# Patient Record
Sex: Female | Born: 2000 | Race: Black or African American | Hispanic: Yes | Marital: Single | Smoking: Former smoker
Health system: Southern US, Community
[De-identification: ages and names within clinical notes are randomized; demographics above are authoritative.]

## PROBLEM LIST (undated history)

## (undated) DIAGNOSIS — A599 Trichomoniasis, unspecified: Secondary | ICD-10-CM

## (undated) DIAGNOSIS — J039 Acute tonsillitis, unspecified: Secondary | ICD-10-CM

## (undated) DIAGNOSIS — J45909 Unspecified asthma, uncomplicated: Secondary | ICD-10-CM

## (undated) DIAGNOSIS — T7840XA Allergy, unspecified, initial encounter: Secondary | ICD-10-CM

## (undated) HISTORY — DX: Unspecified asthma, uncomplicated: J45.909

## (undated) HISTORY — DX: Allergy, unspecified, initial encounter: T78.40XA

---
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
## 2020-04-29 ENCOUNTER — Other Ambulatory Visit: Payer: Self-pay

## 2020-04-29 ENCOUNTER — Encounter (HOSPITAL_COMMUNITY): Payer: Self-pay | Admitting: Emergency Medicine

## 2020-04-29 ENCOUNTER — Emergency Department (HOSPITAL_COMMUNITY)
Admission: EM | Admit: 2020-04-29 | Discharge: 2020-04-29 | Disposition: A | Discharge status: Home / Self Care | Payer: Medicaid Other | Attending: Emergency Medicine | Admitting: Emergency Medicine

## 2020-04-29 DIAGNOSIS — R1030 Lower abdominal pain, unspecified: Secondary | ICD-10-CM | POA: Diagnosis not present

## 2020-04-29 DIAGNOSIS — Z3A00 Weeks of gestation of pregnancy not specified: Secondary | ICD-10-CM | POA: Diagnosis not present

## 2020-04-29 DIAGNOSIS — O26899 Other specified pregnancy related conditions, unspecified trimester: Secondary | ICD-10-CM | POA: Diagnosis not present

## 2020-04-29 DIAGNOSIS — Z5321 Procedure and treatment not carried out due to patient leaving prior to being seen by health care provider: Secondary | ICD-10-CM | POA: Diagnosis not present

## 2020-04-29 LAB — I-STAT BETA HCG BLOOD, ED (MC, WL, AP ONLY): I-stat hCG, quantitative: >2000 m[IU]/mL — ABNORMAL HIGH (ref <5)

## 2020-04-29 LAB — URINALYSIS, ROUTINE W REFLEX MICROSCOPIC
Bacteria, UA: NONE SEEN
Bilirubin Urine: NEGATIVE
Glucose, UA: NEGATIVE mg/dL
Hgb urine dipstick: NEGATIVE
Ketones, ur: 80 mg/dL — AB
Nitrite: NEGATIVE
Protein, ur: NEGATIVE mg/dL
Specific Gravity, Urine: 1.028 (ref 1.005–1.030)
pH: 6 (ref 5.0–8.0)

## 2020-04-29 LAB — COMPREHENSIVE METABOLIC PANEL
ALT: 18 U/L (ref 0–44)
AST: 18 U/L (ref 15–41)
Albumin: 4.4 g/dL (ref 3.5–5.0)
Alkaline Phosphatase: 93 U/L (ref 38–126)
Anion gap: 10 (ref 5–15)
BUN: 13 mg/dL (ref 6–20)
CO2: 22 mmol/L (ref 22–32)
Calcium: 8.9 mg/dL (ref 8.9–10.3)
Chloride: 103 mmol/L (ref 98–111)
Creatinine, Ser: 0.54 mg/dL (ref 0.44–1.00)
GFR calc Af Amer: >60 mL/min (ref >60)
GFR calc non Af Amer: >60 mL/min (ref >60)
Glucose, Bld: 88 mg/dL (ref 70–99)
Potassium: 3.5 mmol/L (ref 3.5–5.1)
Sodium: 135 mmol/L (ref 135–145)
Total Bilirubin: 0.3 mg/dL (ref 0.3–1.2)
Total Protein: 8 g/dL (ref 6.5–8.1)

## 2020-04-29 LAB — LIPASE, BLOOD: Lipase: 31 U/L (ref 11–51)

## 2020-04-29 LAB — CBC
HCT: 38.4 % (ref 36.0–46.0)
Hemoglobin: 12.7 g/dL (ref 12.0–15.0)
MCH: 29.7 pg (ref 26.0–34.0)
MCHC: 33.1 g/dL (ref 30.0–36.0)
MCV: 89.9 fL (ref 80.0–100.0)
Platelets: 335 10*3/uL (ref 150–400)
RBC: 4.27 MIL/uL (ref 3.87–5.11)
RDW: 15.8 % — ABNORMAL HIGH (ref 11.5–15.5)
WBC: 12.8 10*3/uL — ABNORMAL HIGH (ref 4.0–10.5)
nRBC: 0 % (ref 0.0–0.2)

## 2020-04-29 NOTE — ED Triage Notes (Signed)
Patient c/o cramping in lower abdomen x1 week. Reports LMP 8/24. Positive pregnancy test at home 9/29.

## 2020-05-02 ENCOUNTER — Telehealth: Payer: Self-pay | Admitting: *Deleted

## 2020-05-02 NOTE — Telephone Encounter (Signed)
Transition Care Management Unsuccessful Follow-up Telephone Call  Date of discharge and from where:  04/29/20 [name]  Attempts:  1st Attempt  Reason for unsuccessful TCM follow-up call:  No answer/busy

## 2020-05-04 ENCOUNTER — Other Ambulatory Visit: Payer: Self-pay

## 2020-05-04 ENCOUNTER — Ambulatory Visit (INDEPENDENT_AMBULATORY_CARE_PROVIDER_SITE_OTHER): Payer: Medicaid Other

## 2020-05-04 DIAGNOSIS — Z3200 Encounter for pregnancy test, result unknown: Secondary | ICD-10-CM | POA: Diagnosis not present

## 2020-05-04 DIAGNOSIS — Z3400 Encounter for supervision of normal first pregnancy, unspecified trimester: Secondary | ICD-10-CM

## 2020-05-04 LAB — POCT URINE PREGNANCY: Preg Test, Ur: POSITIVE — AB

## 2020-05-04 MED ORDER — BLOOD PRESSURE KIT DEVI: 1.0000 | 0 refills | Status: DC | PRN

## 2020-05-04 NOTE — Progress Notes (Signed)
Patient was assessed and managed by nursing staff during this encounter. I have reviewed the chart and agree with the documentation and plan. I have also made any necessary editorial changes.  Catalina Antigua, MD 05/04/2020 2:37 PM

## 2020-05-04 NOTE — Telephone Encounter (Signed)
Transition Care Management Unsuccessful Follow-up Telephone Call  Date of discharge and from where:  04/29/20 [name]  Attempts:  2nd Attempt  Reason for unsuccessful TCM follow-up call:  No answer/busy

## 2020-05-04 NOTE — Progress Notes (Signed)
..    In-person:  Location:[facility] Patient: Natalie [name] Provider: Nurse   I discussed the limitations, risks, security and privacy concerns of performing an evaluation and management service by telephone and the availability of in person appointments. I also discussed with the patient that there may be a patient responsible charge related to this service. The patient expressed understanding and agreed to proceed.   History of Present Illness: PRENATAL INTAKE SUMMARY  Natalie [name] presents today New OB Nurse Interview.  OB History    Gravida  1   Para      Term      Preterm      AB      Living        SAB      TAB      Ectopic      Multiple      Live Births             I have reviewed the patient's medical, obstetrical, social, and family histories, medications, and available lab results.  SUBJECTIVE She has no unusual complaints. Pt reports that FOB is involved and they both live with her mother.   Observations/Objective: Initial nurse interview for history/labs (New OB)  EDD: 12-29-20 GA: [redacted]w[redacted]d GP: G1P0   GENERAL APPEARANCE: alert, well appearing  Assessment and Plan: Normal pregnancy PHQ-9=8 Babyscripts and blood pressure cuff sent. Follow Up Instructions:   I discussed the assessment and treatment plan with the patient. The patient was provided an opportunity to ask questions and all were answered. The patient agreed with the plan and demonstrated an understanding of the instructions.   The patient was advised to call back or seek an in-person evaluation if the symptoms worsen or if the condition fails to improve as anticipated.  I provided 15 minutes of non-face-to-face time during this encounter.   Katrina Stack, RN

## 2020-05-11 ENCOUNTER — Other Ambulatory Visit: Payer: Self-pay

## 2020-05-11 MED ORDER — PROMETHAZINE HCL 25 MG PO TABS: 25.0000 mg | ORAL_TABLET | Freq: Four times a day (QID) | ORAL | 0 refills | Status: DC | PRN

## 2020-05-11 NOTE — Progress Notes (Signed)
Pt called reporting pregnancy related nausea and vomiting for the past 3 days. Pt denies any other symptoms. I advised pt I will send phenergan rx to her pharmacy and for her to let us know if it does not help with symptoms or to go to the hospital for evaluation. Pt voices understanding.

## 2020-05-19 DIAGNOSIS — Z3400 Encounter for supervision of normal first pregnancy, unspecified trimester: Secondary | ICD-10-CM | POA: Diagnosis not present

## 2020-06-08 ENCOUNTER — Ambulatory Visit (INDEPENDENT_AMBULATORY_CARE_PROVIDER_SITE_OTHER): Payer: Medicaid Other

## 2020-06-08 ENCOUNTER — Other Ambulatory Visit: Payer: Self-pay

## 2020-06-08 DIAGNOSIS — Z3481 Encounter for supervision of other normal pregnancy, first trimester: Secondary | ICD-10-CM

## 2020-06-08 DIAGNOSIS — O3680X0 Pregnancy with inconclusive fetal viability, not applicable or unspecified: Secondary | ICD-10-CM

## 2020-06-08 DIAGNOSIS — Z789 Other specified health status: Secondary | ICD-10-CM

## 2020-06-08 DIAGNOSIS — Z3A10 10 weeks gestation of pregnancy: Secondary | ICD-10-CM | POA: Diagnosis not present

## 2020-06-08 DIAGNOSIS — O219 Vomiting of pregnancy, unspecified: Secondary | ICD-10-CM

## 2020-06-08 MED ORDER — DOXYLAMINE-PYRIDOXINE 10-10 MG PO TBEC: 2.0000 | DELAYED_RELEASE_TABLET | Freq: Every day | ORAL | 5 refills | Status: DC

## 2020-06-08 NOTE — Progress Notes (Signed)
DATING AND VIABILITY SONOGRAM   Natalie [name] is a 19 y.o. year old G1P0 with LMP Patient's last menstrual period was 03/24/2020. which would correlate to  [redacted]w[redacted]d weeks gestation.  She has regular menstrual cycles.   She is here today for a confirmatory initial sonogram.    GESTATION: [redacted]w[redacted]d by CRL [place]     FETAL ACTIVITY:          Heart rate         149          The fetus is active.  Gestational criteria: Estimated Date of Delivery: 12/29/20 by LMP now at [redacted]w[redacted]d  Previous Scans:0              CROWN RUMP LENGTH 37.8 mm [redacted]w[redacted]d                                                                               AVERAGE EGA(BY THIS SCAN):  10 weeks  WORKING EDD( LMP ):  12/30/19     TECHNICIAN COMMENTS: Single live IUP at [redacted]w[redacted]d by CRL. FHR 149 New OB visit scheduled 06/15/20   A copy of this report including all images has been saved and backed up to a second source for retrieval if needed. All measures and details of the anatomical scan, placentation, fluid volume and pelvic anatomy are contained in that report.  Nigil Braman Natalie [name] 06/08/2020 1:37 PM

## 2020-06-15 ENCOUNTER — Encounter: Payer: Medicaid Other | Admitting: Certified Nurse Midwife

## 2020-06-27 ENCOUNTER — Other Ambulatory Visit (HOSPITAL_COMMUNITY)
Admission: RE | Admit: 2020-06-27 | Discharge: 2020-06-27 | Disposition: A | Discharge status: Home / Self Care | Payer: Medicaid Other | Source: Ambulatory Visit | Attending: Advanced Practice Midwife | Admitting: Advanced Practice Midwife

## 2020-06-27 ENCOUNTER — Encounter: Payer: Self-pay | Admitting: Advanced Practice Midwife

## 2020-06-27 ENCOUNTER — Other Ambulatory Visit: Payer: Self-pay

## 2020-06-27 ENCOUNTER — Ambulatory Visit (INDEPENDENT_AMBULATORY_CARE_PROVIDER_SITE_OTHER): Payer: Medicaid Other | Admitting: Advanced Practice Midwife

## 2020-06-27 VITALS — Wt 168.0 lb

## 2020-06-27 DIAGNOSIS — Z3401 Encounter for supervision of normal first pregnancy, first trimester: Secondary | ICD-10-CM | POA: Diagnosis not present

## 2020-06-27 DIAGNOSIS — Z3A13 13 weeks gestation of pregnancy: Secondary | ICD-10-CM | POA: Diagnosis not present

## 2020-06-27 DIAGNOSIS — O99511 Diseases of the respiratory system complicating pregnancy, first trimester: Secondary | ICD-10-CM

## 2020-06-27 DIAGNOSIS — Z3402 Encounter for supervision of normal first pregnancy, second trimester: Secondary | ICD-10-CM | POA: Diagnosis not present

## 2020-06-27 DIAGNOSIS — Z3400 Encounter for supervision of normal first pregnancy, unspecified trimester: Secondary | ICD-10-CM

## 2020-06-27 DIAGNOSIS — Z3481 Encounter for supervision of other normal pregnancy, first trimester: Secondary | ICD-10-CM | POA: Diagnosis not present

## 2020-06-27 DIAGNOSIS — J45909 Unspecified asthma, uncomplicated: Secondary | ICD-10-CM | POA: Insufficient documentation

## 2020-06-27 DIAGNOSIS — O219 Vomiting of pregnancy, unspecified: Secondary | ICD-10-CM

## 2020-06-27 MED ORDER — ASPIRIN EC 81 MG PO TBEC: 81.0000 mg | DELAYED_RELEASE_TABLET | Freq: Every day | ORAL | 11 refills | Status: DC

## 2020-06-27 NOTE — Progress Notes (Signed)
Subjective:   Natalie [name] is a 19 y.o. G1P0 at [redacted]w[redacted]d by LMP being seen today for her first obstetrical visit.  Her obstetrical history is significant for none, G1 and has Asthma, mild intermittent, well-controlled; Allergic rhinitis; Body mass index, pediatric, 85th percentile to less than 95th percentile for age; Adjustment disorder with depressed mood; Parent-child relational problem; Dysmenorrhea; Hidradenitis suppurativa; and Supervision of normal first pregnancy, antepartum on their problem list.. Patient does intend to breast feed. Pregnancy history fully reviewed.  Patient reports no complaints.  HISTORY: OB History  Gravida Para Term Preterm AB Living  1 0 0 0 0 0  SAB TAB Ectopic Multiple Live Births  0 0 0 0 0    # Outcome Date GA Lbr Len/2nd Weight Sex Delivery Anes PTL Lv  1 Current            Past Medical History:  Diagnosis Date  . Allergy   . Asthma   . Tonsillitis   . Trichomoniasis    Past Surgical History:  Procedure Laterality Date  . HYDRADENITIS EXCISION Bilateral 12/08/2019   Procedure: WIDE EXCISION BILATERAL AXILLARY HIDRADENITIS;  Surgeon: Coralie Keens, MD;  Location: [facility];  Service: General;  Laterality: Bilateral;   Family History  Problem Relation Age of Onset  . Learning disabilities Mother   . Mental illness Mother        Depression  . Diabetes Paternal Aunt   . Asthma Maternal Grandmother   . Hypertension Maternal Grandmother   . Learning disabilities Maternal Grandmother   . Mental illness Maternal Grandmother        Depression  . Stroke Maternal Grandmother   . Hypertension Maternal Grandfather   . Alcohol abuse Maternal Grandfather   . Drug abuse Maternal Grandfather    Social History   Tobacco Use  . Smoking status: Former Smoker    Types: Cigars  . Smokeless tobacco: Never Used  . Tobacco comment: last used 03-24-20  Vaping Use  . Vaping Use: Every day  Substance Use Topics  . Alcohol use: Yes    Comment: last  used 03-23-20  . Drug use: Yes    Types: Marijuana    Comment: last used 03-23-20   Allergies  Allergen Reactions  . Pollen Extract Other (See Comments)    Seasonal allergies    Current Outpatient Medications on File Prior to Visit  Medication Sig Dispense Refill  . Doxylamine-Pyridoxine (DICLEGIS) 10-10 MG TBEC Take 2 tablets by mouth at bedtime. If symptoms persist, add one tablet in the morning and one in the afternoon 100 tablet 5  . albuterol (PROVENTIL HFA;VENTOLIN HFA) 108 (90 Base) MCG/ACT inhaler 2 puffs every 4-6 hours when wheezing 1 Inhaler 1  . Blood Pressure Monitoring (BLOOD PRESSURE KIT) DEVI 1 kit by Does not apply route as needed. 1 each 0  . cetirizine (ZYRTEC) 10 MG tablet Take one tablet by mouth once daily for allergies 30 tablet 11  . promethazine (PHENERGAN) 25 MG tablet Take 1 tablet (25 mg total) by mouth every 6 (six) hours as needed for nausea or vomiting. 30 tablet 0   No current facility-administered medications on file prior to visit.     Indications for ASA therapy (per uptodate) One of the following: Previous pregnancy with preeclampsia, especially early onset and with an adverse outcome No Multifetal gestation No Chronic hypertension No Type 1 or 2 diabetes mellitus No Chronic kidney disease No Autoimmune disease (antiphospholipid syndrome, systemic lupus erythematosus) No  Two or more of the following: Nulliparity Yes Obesity (body mass index >30 kg/m2) Yes Family history of preeclampsia in mother or sister No Age ?35 years No Sociodemographic characteristics (African American race, low socioeconomic level) Yes Personal risk factors (eg, previous pregnancy with low birth weight or small for gestational age infant, previous adverse pregnancy outcome [eg, stillbirth], interval >10 years between pregnancies) No   Indications for early 1 hour GTT (per uptodate)  BMI >25 (>23 in Asian women) AND one of the following  Gestational diabetes mellitus  in a previous pregnancy No Glycated hemoglobin ?5.7 percent (39 mmol/mol), impaired glucose tolerance, or impaired fasting glucose on previous testing No First-degree relative with diabetes No High-risk race/ethnicity (eg, African American, Latino, Native American, [place] American, Pacific Islander) Yes History of cardiovascular disease No Hypertension or on therapy for hypertension No High-density lipoprotein cholesterol level <35 mg/dL (0.90 mmol/L) and/or a triglyceride level >250 mg/dL (2.82 mmol/L) No Polycystic ovary syndrome No Physical inactivity No Other clinical condition associated with insulin resistance (eg, severe obesity, acanthosis nigricans) No Previous birth of an infant weighing ?4000 g No Previous stillbirth of unknown cause No Exam   Vitals:   06/27/20 1428  Weight: 168 lb (76.2 kg)   Fetal Heart Rate (bpm): 150  Uterus:     Pelvic Exam: Perineum: no hemorrhoids, normal perineum   Vulva: normal external genitalia, no lesions   Vagina:  normal mucosa, normal discharge   Cervix: no lesions and normal, pap smear done.    Adnexa: normal adnexa and no mass, fullness, tenderness   Bony Pelvis: average  System: General: well-developed, well-nourished female in no acute distress   Breast:  normal appearance, no masses or tenderness   Skin: normal coloration and turgor, no rashes   Neurologic: oriented, normal, negative, normal mood   Extremities: normal strength, tone, and muscle mass, ROM of all joints is normal   HEENT PERRLA, extraocular movement intact and sclera clear, anicteric   Mouth/Teeth mucous membranes moist, pharynx normal without lesions and dental hygiene good   Neck supple and no masses   Cardiovascular: regular rate and rhythm   Respiratory:  no respiratory distress, normal breath sounds   Abdomen: soft, non-tender; bowel sounds normal; no masses,  no organomegaly     Assessment:   Pregnancy: G1P0 Patient Active Problem List   Diagnosis Date  Noted  . Supervision of normal first pregnancy, antepartum 05/04/2020  . Hidradenitis suppurativa 10/08/2019  . Parent-child relational problem 05/02/2015  . Dysmenorrhea 05/02/2015  . Adjustment disorder with depressed mood 03/18/2014  . Asthma, mild intermittent, well-controlled 10/15/2013  . Allergic rhinitis 10/15/2013  . Body mass index, pediatric, 85th percentile to less than 95th percentile for age 37/19/2015     Plan:  1. Encounter for supervision of normal first pregnancy in first trimester --Anticipatory guidance about next visits/weeks of pregnancy given. --Next visit in 4 weeks in office for AFP  --At risk for PEC based on nulliparity, BMI, AA race, start BASA today - CBC/D/Plt+RPR+Rh+ABO+Rub Ab... - Genetic Screening - Culture, OB Urine - Cervicovaginal ancillary only( [facility])  2. Nausea and vomiting during pregnancy --Improved at this time   Initial labs drawn. Continue prenatal vitamins. Discussed and offered genetic screening options, including Quad screen/AFP, NIPS testing, and option to decline testing. Benefits/risks/alternatives reviewed. Pt aware that anatomy US is form of genetic screening with lower accuracy in detecting trisomies than blood work.  Pt chooses genetic screening today. NIPS: ordered. Ultrasound discussed; fetal anatomic survey: requested. Problem  list reviewed and updated. The nature of [facility] with multiple MDs and other Advanced Practice Providers was explained to patient; also emphasized that residents, students are part of our team. Routine obstetric precautions reviewed. Return in about 4 weeks (around 07/25/2020).   Fatima Blank, CNM 06/27/20 4:21 PM

## 2020-06-28 LAB — CBC/D/PLT+RPR+RH+ABO+RUB AB...
Antibody Screen: NEGATIVE
Basophils Absolute: 0 10*3/uL (ref 0.0–0.2)
Basos: 0 %
EOS (ABSOLUTE): 0.1 10*3/uL (ref 0.0–0.4)
Eos: 1 %
HCV Ab: <0.1 s/co ratio (ref 0.0–0.9)
HIV Screen 4th Generation wRfx: NONREACTIVE
Hematocrit: 35.9 % (ref 34.0–46.6)
Hemoglobin: 12.2 g/dL (ref 11.1–15.9)
Hepatitis B Surface Ag: NEGATIVE
Immature Grans (Abs): 0 10*3/uL (ref 0.0–0.1)
Immature Granulocytes: 0 %
Lymphocytes Absolute: 1.5 10*3/uL (ref 0.7–3.1)
Lymphs: 13 %
MCH: 29.8 pg (ref 26.6–33.0)
MCHC: 34 g/dL (ref 31.5–35.7)
MCV: 88 fL (ref 79–97)
Monocytes Absolute: 0.5 10*3/uL (ref 0.1–0.9)
Monocytes: 4 %
Neutrophils Absolute: 9.6 10*3/uL — ABNORMAL HIGH (ref 1.4–7.0)
Neutrophils: 82 %
Platelets: 319 10*3/uL (ref 150–450)
RBC: 4.09 x10E6/uL (ref 3.77–5.28)
RDW: 14.2 % (ref 11.7–15.4)
RPR Ser Ql: NONREACTIVE
Rh Factor: POSITIVE
Rubella Antibodies, IGG: 11.1 index (ref >0.99)
WBC: 11.7 10*3/uL — ABNORMAL HIGH (ref 3.4–10.8)

## 2020-06-28 LAB — HCV INTERPRETATION

## 2020-06-28 NOTE — Progress Notes (Signed)
Subjective: Natalie [name] is a G1P0 at [redacted]w[redacted]d who presents to the [facility] today for initial ob visit.  She does not  have a history of any mental health concerns. She is  currently sexually active. Natalie [name] reports history using nexplanon for birth control. She has had recent STD screening on 09/20/2019 and was tested for Gonorrhea and Chlamydia . Patient states family as her support system.   Wt 168 lb (76.2 kg)   LMP 03/24/2020   BMI 31.74 kg/m   Birth Control History:  nexplanon  MDM Patient counseled on all options for birth control today including LARC. Patient desires pills initiated for birth control   Assessment:  19 y.o. female considering pills  for birth control  Plan: Additional contraception counseling  Gwyndolyn Saxon, [place] 06/28/2020 11:02 AM

## 2020-06-29 LAB — CERVICOVAGINAL ANCILLARY ONLY
Chlamydia: NEGATIVE
Comment: NEGATIVE
Comment: NEGATIVE
Comment: NORMAL
Neisseria Gonorrhea: NEGATIVE
Trichomonas: NEGATIVE

## 2020-06-29 LAB — CULTURE, OB URINE

## 2020-06-29 LAB — URINE CULTURE, OB REFLEX

## 2020-07-04 DIAGNOSIS — L732 Hidradenitis suppurativa: Secondary | ICD-10-CM | POA: Diagnosis not present

## 2020-07-05 ENCOUNTER — Encounter: Payer: Self-pay | Admitting: Advanced Practice Midwife

## 2020-07-25 ENCOUNTER — Encounter: Payer: Medicaid Other | Admitting: Advanced Practice Midwife

## 2020-07-25 ENCOUNTER — Ambulatory Visit: Payer: Medicaid Other | Admitting: Licensed Clinical Social Worker

## 2020-07-26 ENCOUNTER — Encounter: Payer: Medicaid Other | Admitting: Advanced Practice Midwife

## 2020-07-26 ENCOUNTER — Ambulatory Visit: Payer: Medicaid Other | Admitting: Licensed Clinical Social Worker

## 2020-08-04 ENCOUNTER — Encounter: Payer: Medicaid Other | Admitting: Obstetrics and Gynecology

## 2020-08-08 ENCOUNTER — Other Ambulatory Visit: Payer: Self-pay

## 2020-08-08 ENCOUNTER — Ambulatory Visit (INDEPENDENT_AMBULATORY_CARE_PROVIDER_SITE_OTHER): Payer: Medicaid Other | Admitting: Advanced Practice Midwife

## 2020-08-08 ENCOUNTER — Ambulatory Visit (INDEPENDENT_AMBULATORY_CARE_PROVIDER_SITE_OTHER): Payer: Medicaid Other | Admitting: Licensed Clinical Social Worker

## 2020-08-08 VITALS — BP 108/64 | HR 85 | Wt 170.0 lb

## 2020-08-08 DIAGNOSIS — Z3A00 Weeks of gestation of pregnancy not specified: Secondary | ICD-10-CM | POA: Diagnosis not present

## 2020-08-08 DIAGNOSIS — Z3400 Encounter for supervision of normal first pregnancy, unspecified trimester: Secondary | ICD-10-CM | POA: Diagnosis not present

## 2020-08-08 DIAGNOSIS — F4321 Adjustment disorder with depressed mood: Secondary | ICD-10-CM

## 2020-08-08 DIAGNOSIS — Z3A19 19 weeks gestation of pregnancy: Secondary | ICD-10-CM

## 2020-08-08 DIAGNOSIS — O99340 Other mental disorders complicating pregnancy, unspecified trimester: Secondary | ICD-10-CM

## 2020-08-08 DIAGNOSIS — Z131 Encounter for screening for diabetes mellitus: Secondary | ICD-10-CM | POA: Diagnosis not present

## 2020-08-08 NOTE — Patient Instructions (Signed)

## 2020-08-08 NOTE — BH Specialist Note (Signed)
Integrated Behavioral Health Initial   MRN: [id number] Name: Natalie [name]  Number of Integrated Behavioral Health Clinician visits:: 1/6 Session Start time: 3:15pm  Session End time: 3:40pm Total time: 25 min via mychart    Types of Service: General Integrated Behavioral Health   Interpretor:no  Interpretor Name and Language: none   Warm Hand Off Completed.       Subjective: Natalie [name] is a 20 y.o. female accompanied by n/a Patient was referred by L. Leftwich Craige Cotta CNM for . Patient reports the following symptoms/concerns: history of adjustment disorder  Duration of problem: over two years ; Severity of problem: mild   Objective:: Mood: good  and Affect: congruent  Risk of harm to self or others: no risk of harm to self or others.   Life Context: Family and Social: Lives with parents in [place]  School/Work: [employer]  Self-Care: Watch movies  Life Changes: New pregnancy   Patient and/or Family's Strengths/Protective Factors: Patient has secure social connections in place   Goals Addressed: Patient will: 1. Reduce symptoms of: adjustment disorder  2. Increase knowledge and/or ability of: healthy coping skills   3. Demonstrate ability to: self manage symptoms   Progress towards Goals: Ongoing   Interventions: Interventions utilized: supportive counseling   Standardized Assessments completed:  Psychologist, occupational Health from 08/08/2020 in [facility]  PHQ-9 Total Score 8       Assessment: Patient currently experiencing adjustment disorder with depressed mood    Patient may benefit from integrated behavioral health   Plan: 1. Follow up with behavioral health clinician on : 3 weeks via mychart  2. Behavioral recommendations: schedule self care to boost mood, eat small meals throughout the day, prioritize rest and continue taking prenatal vitamins  3. Referral(s): [facility], and car seat program with  hospital  4. "From scale of 1-10, how likely are you to follow plan?":   Gwyndolyn Saxon, LCSW

## 2020-08-08 NOTE — Progress Notes (Signed)
   PRENATAL VISIT NOTE  Subjective:  Natalie [name] is a 20 y.o. G1P0 at [redacted]w[redacted]d being seen today for ongoing prenatal care.  She is currently monitored for the following issues for this low-risk pregnancy and has Asthma, mild intermittent, well-controlled; Allergic rhinitis; Body mass index, pediatric, 85th percentile to less than 95th percentile for age; Adjustment disorder with depressed mood; Parent-child relational problem; Dysmenorrhea; Hidradenitis suppurativa; Supervision of normal first pregnancy, antepartum; and Asthma affecting pregnancy in first trimester on their problem list.  Patient reports no contractions, no cramping and no leaking.  Contractions: Not present. Vag. Bleeding: None.  Movement: Present. Denies leaking of fluid.   The following portions of the patient's history were reviewed and updated as appropriate: allergies, current medications, past family history, past medical history, past social history, past surgical history and problem list.   Objective:   Vitals:   08/08/20 0949  BP: 108/64  Pulse: 85  Weight: 77.1 kg    Fetal Status: Fetal Heart Rate (bpm): 144   Movement: Present     General:  Alert, oriented and cooperative. Patient is in no acute distress.  Skin: Skin is warm and dry. No rash noted.   Cardiovascular: Normal heart rate noted  Respiratory: Normal respiratory effort, no problems with respiration noted  Abdomen: Soft, gravid, appropriate for gestational age.  Pain/Pressure: Present     Pelvic: Cervical exam deferred        Extremities: Normal range of motion.  Edema: None  Mental Status: Normal mood and affect. Normal behavior. Normal judgment and thought content.   Assessment and Plan:  Pregnancy: G1P0 at [redacted]w[redacted]d 1. Supervision of normal first pregnancy, antepartum  - AFP, Serum, Open Spina Bifida - HgB A1c - [place] MFM OB COMP + 14 WK; Future  2. [redacted] weeks gestation of pregnancy  - [place] MFM OB COMP + 14 WK; Future  Preterm labor symptoms and  general obstetric precautions including but not limited to vaginal bleeding, contractions, leaking of fluid and fetal movement were reviewed in detail with the patient. Please refer to After Visit Summary for other counseling recommendations.   Return in about 4 weeks (around 09/05/2020).  No future appointments.  Sande Rives, Student-MidWife

## 2020-08-10 LAB — AFP, SERUM, OPEN SPINA BIFIDA
AFP MoM: 1.15
AFP Value: 63.4 ng/mL
Gest. Age on Collection Date: 19.6 weeks
Maternal Age At EDD: 20.1 yr
OSBR Risk 1 IN: 10000
Test Results:: NEGATIVE
Weight: 170 [lb_av]

## 2020-08-10 LAB — HEMOGLOBIN A1C
Est. average glucose Bld gHb Est-mCnc: 105 mg/dL
Hgb A1c MFr Bld: 5.3 % (ref 4.8–5.6)

## 2020-08-22 ENCOUNTER — Ambulatory Visit: Payer: Medicaid Other | Attending: Advanced Practice Midwife

## 2020-08-22 ENCOUNTER — Other Ambulatory Visit: Payer: Self-pay

## 2020-08-22 DIAGNOSIS — Z3A19 19 weeks gestation of pregnancy: Secondary | ICD-10-CM

## 2020-08-22 DIAGNOSIS — Z3400 Encounter for supervision of normal first pregnancy, unspecified trimester: Secondary | ICD-10-CM | POA: Diagnosis not present

## 2020-08-23 ENCOUNTER — Other Ambulatory Visit: Payer: Self-pay | Admitting: *Deleted

## 2020-08-23 DIAGNOSIS — Z148 Genetic carrier of other disease: Secondary | ICD-10-CM

## 2020-09-05 ENCOUNTER — Telehealth (INDEPENDENT_AMBULATORY_CARE_PROVIDER_SITE_OTHER): Payer: Medicaid Other | Admitting: Advanced Practice Midwife

## 2020-09-05 VITALS — BP 129/74 | HR 87 | Wt 170.0 lb

## 2020-09-05 DIAGNOSIS — O99512 Diseases of the respiratory system complicating pregnancy, second trimester: Secondary | ICD-10-CM

## 2020-09-05 DIAGNOSIS — L732 Hidradenitis suppurativa: Secondary | ICD-10-CM

## 2020-09-05 DIAGNOSIS — O4702 False labor before 37 completed weeks of gestation, second trimester: Secondary | ICD-10-CM

## 2020-09-05 DIAGNOSIS — J45909 Unspecified asthma, uncomplicated: Secondary | ICD-10-CM

## 2020-09-05 DIAGNOSIS — O479 False labor, unspecified: Secondary | ICD-10-CM

## 2020-09-05 DIAGNOSIS — G4700 Insomnia, unspecified: Secondary | ICD-10-CM

## 2020-09-05 DIAGNOSIS — O99891 Other specified diseases and conditions complicating pregnancy: Secondary | ICD-10-CM

## 2020-09-05 DIAGNOSIS — Z3400 Encounter for supervision of normal first pregnancy, unspecified trimester: Secondary | ICD-10-CM

## 2020-09-05 DIAGNOSIS — Z3A23 23 weeks gestation of pregnancy: Secondary | ICD-10-CM

## 2020-09-05 NOTE — Progress Notes (Signed)
OBSTETRICS PRENATAL VIRTUAL VISIT ENCOUNTER NOTE  Provider location: [facility] for [facility]   I connected with Natalie [name] on 09/05/20 at  4:00 PM EST by MyChart Video Encounter at home and verified that I am speaking with the correct person using two identifiers.   I discussed the limitations, risks, security and privacy concerns of performing an evaluation and management service virtually and the availability of in person appointments. I also discussed with the patient that there may be a patient responsible charge related to this service. The patient expressed understanding and agreed to proceed. Subjective:  Natalie [name] is a 20 y.o. G1P0 at [redacted]w[redacted]d being seen today for ongoing prenatal care.  She is currently monitored for the following issues for this low-risk pregnancy and has Asthma, mild intermittent, well-controlled; Allergic rhinitis; Body mass index, pediatric, 85th percentile to less than 95th percentile for age; Adjustment disorder with depressed mood; Parent-child relational problem; Dysmenorrhea; Hidradenitis suppurativa; Supervision of normal first pregnancy, antepartum; and Asthma affecting pregnancy in first trimester on their problem list.  Patient reports no complaints.  Contractions: Irritability. Vag. Bleeding: None.  Movement: Present. Denies any leaking of fluid.   The following portions of the patient's history were reviewed and updated as appropriate: allergies, current medications, past family history, past medical history, past social history, past surgical history and problem list.   Objective:   Vitals:   09/05/20 1507  BP: 129/74  Pulse: 87  Weight: 170 lb (77.1 kg)    Fetal Status:     Movement: Present     General:  Alert, oriented and cooperative. Patient is in no acute distress.  Respiratory: Normal respiratory effort, no problems with respiration noted  Mental Status: Normal mood and affect. Normal behavior. Normal judgment and  thought content.  Rest of physical exam deferred due to type of encounter  Imaging: [place] MFM OB DETAIL +14 WK  Result Date: 08/22/2020 ----------------------------------------------------------------------  OBSTETRICS REPORT                       (Signed Final 08/22/2020 05:42 pm) ---------------------------------------------------------------------- Patient Info  ID #:       [id number]                          D.O.B.:  07/06/2001 (19 yrs)  Name:       Natalie [name]                  Visit Date: 08/22/2020 03:07 pm ---------------------------------------------------------------------- Performed By  Attending:        Ma Rings MD         Ref. Address:     [address]                                                             [address]  Performed By:     Tommie Raymond BS,       [facility]  Referred By:      Conan Bowens                    MD ---------------------------------------------------------------------- Orders  #  Description                           Code        Ordered By  1  [place] MFM OB DETAIL +14 WK               76811.01    Lottie Sigman LEFTWICH-                                                       KIRBY ----------------------------------------------------------------------  #  Order #                     Accession #                Episode #  1  [id number]                   [id number]                 [id number] ---------------------------------------------------------------------- Indications  Obesity complicating pregnancy, second         O99.212  trimester (BMI 31)  [redacted] weeks gestation of pregnancy                Z3A.21  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 Cord Insertion:      Visualized, central  Amniotic Fluid  AFI FV:      Within normal limits  Largest Pocket(cm)                              6.2 ---------------------------------------------------------------------- Biometry  BPD:      48.4  mm     G. Age:  20w 4d         23  %    CI:        72.43   %    70 - 86                                                          FL/HC:      18.9   %    15.9 - 20.3  HC:      180.9  mm     G. Age:  20w 4d         12  %    HC/AC:      1.09        1.06 - 1.25  AC:      166.1  mm     G. Age:  21w 4d         55  %    FL/BPD:     70.7   %  FL:       34.2  mm     G. Age:  20w 5d         23  %    FL/AC:      20.6   %    20 - 24  HUM:      33.4  mm     G. Age:  21w 2d         48  %  CER:      21.2  mm     G. Age:  20w 1d         25  %  NFT:       4.2  mm  LV:        6.4  mm  CM:          4  mm  Est. FW:     402  gm    0 lb 14 oz      37  % ---------------------------------------------------------------------- OB History  Blood Type:   O+  Gravidity:    1         Term:   0        Prem:   0        SAB:   0  TOP:          0       Ectopic:  0        Living: 0 ---------------------------------------------------------------------- Gestational Age  LMP:           21w 4d        Date:  03/24/20                 EDD:   12/29/20  U/S Today:     20w 6d                                        EDD:   01/03/21  Best:          21w 2d     Det. By:  U/S C R L  (06/08/20)    EDD:   12/31/20 ---------------------------------------------------------------------- Anatomy  Cranium:               Appears normal          Aortic Arch:            Not well visualized  Cavum:                 Appears normal         Ductal Arch:            Not well visualized  Ventricles:            Appears normal         Diaphragm:              Appears normal  Choroid Plexus:        Appears normal         Stomach:                Appears normal, left                                                                        sided  Cerebellum:            Appears normal         Abdomen:                Appears normal  Posterior Fossa:       Appears normal         Abdominal Wall:         Appears nml (cord                                                                        insert, abd wall)  Nuchal Fold:           Appears normal         Cord Vessels:           Appears normal (3                                                                        vessel cord)  Face:                  Appears normal         Kidneys:                Appear normal                         (  orbits and profile)  Lips:                  Appears normal         Bladder:                Appears normal  Thoracic:              Appears normal         Spine:                  Appears normal  Heart:                 Appears normal         Upper Extremities:      Visualized                         (4CH, axis, and                         situs)  RVOT:                  Appears normal         Lower Extremities:      Appears normal  LVOT:                  Appears normal  Other:  Fetus appears to be female. Nasal bone visualized. Heels appear          normal. Technically difficult due to fetal Technically difficult due to          maternal habitus and fetal position. ---------------------------------------------------------------------- Cervix Uterus Adnexa  Cervix  Length:           3.26  cm.  Normal appearance by transabdominal scan.  Uterus  No abnormality visualized.  Right Ovary  Within normal limits.  Left Ovary  Within normal limits.  Cul De Sac  No free fluid seen.  Adnexa  No abnormality  visualized. ---------------------------------------------------------------------- Comments  This patient was seen for a detailed fetal anatomy scan due  to maternal obesity.  She denies any significant past medical history and denies  any problems in her current pregnancy.  She had a cell free DNA test earlier in her pregnancy which  indicated a low risk for trisomy 3421, 3118, and 13. A female fetus  is predicted.  She was informed that the fetal growth and amniotic fluid  level were appropriate for her gestational age.  There were no obvious fetal anomalies noted on today's  ultrasound exam.  However, the views of the fetal anatomy  were limited today due to the fetal position.  The patient was informed that anomalies may be missed due  to technical limitations. If the fetus is in a suboptimal position  or maternal habitus is increased, visualization of the fetus in  the maternal uterus may be impaired.  A follow-up exam was scheduled in 4 weeks to complete the  views of the fetal anatomy. ----------------------------------------------------------------------                   Ma RingsVictor Fang, MD Electronically Signed Final Report   08/22/2020 05:42 pm ----------------------------------------------------------------------   Assessment and Plan:  Pregnancy: G1P0 at 6341w4d 1. Supervision of normal first pregnancy, antepartum --Pt reports good fetal movement, denies cramping, LOF, or vaginal bleeding --Anticipatory guidance about next visits/weeks of pregnancy given. --Next visit in office in 4 weeks for  GTT  2. Braxton Hicks contractions --Irregular mild cramping, not every day --Increase PO fluids, rest/ice/heat/warm bath/Tylenol   3. Insomnia, unspecified type --Discussed good sleep hygiene and safe OTC medications/supplements in pregnancy  Preterm labor symptoms and general obstetric precautions including but not limited to vaginal bleeding, contractions, leaking of fluid and fetal movement were  reviewed in detail with the patient. I discussed the assessment and treatment plan with the patient. The patient was provided an opportunity to ask questions and all were answered. The patient agreed with the plan and demonstrated an understanding of the instructions. The patient was advised to call back or seek an in-person office evaluation/go to [facility]U at [facility] for any urgent or concerning symptoms. Please refer to After Visit Summary for other counseling recommendations.   I provided 10 minutes of face-to-face time during this encounter.  Return in about 4 weeks (around 10/03/2020).  Future Appointments  Date Time Provider Department Center  09/19/2020  7:15 AM [facility] NURSE [facility]  09/19/2020  7:30 AM [facility] US3 [facility]  10/03/2020  9:15 AM [facility] LAB [facility] None  10/03/2020 10:55 AM Leftwich-Kirby, Wilmer Floor, CNM [facility] None    Sharen Counter, CNM [facility] [employer], [facility]

## 2020-09-05 NOTE — Progress Notes (Signed)
+   Fetal movement. Pt states she is having some increased cramping.

## 2020-09-12 ENCOUNTER — Other Ambulatory Visit: Payer: Self-pay | Admitting: Pediatrics

## 2020-09-12 DIAGNOSIS — J3089 Other allergic rhinitis: Secondary | ICD-10-CM

## 2020-09-19 ENCOUNTER — Ambulatory Visit: Payer: Medicaid Other | Admitting: *Deleted

## 2020-09-19 ENCOUNTER — Other Ambulatory Visit: Payer: Self-pay

## 2020-09-19 ENCOUNTER — Encounter: Payer: Self-pay | Admitting: *Deleted

## 2020-09-19 ENCOUNTER — Ambulatory Visit: Payer: Medicaid Other | Attending: Obstetrics

## 2020-09-19 VITALS — BP 118/46 | HR 76

## 2020-09-19 DIAGNOSIS — Z3A25 25 weeks gestation of pregnancy: Secondary | ICD-10-CM | POA: Diagnosis not present

## 2020-09-19 DIAGNOSIS — O321XX0 Maternal care for breech presentation, not applicable or unspecified: Secondary | ICD-10-CM | POA: Diagnosis not present

## 2020-09-19 DIAGNOSIS — O99512 Diseases of the respiratory system complicating pregnancy, second trimester: Secondary | ICD-10-CM

## 2020-09-19 DIAGNOSIS — J45909 Unspecified asthma, uncomplicated: Secondary | ICD-10-CM | POA: Diagnosis not present

## 2020-09-19 DIAGNOSIS — O99212 Obesity complicating pregnancy, second trimester: Secondary | ICD-10-CM | POA: Diagnosis not present

## 2020-09-19 DIAGNOSIS — Z148 Genetic carrier of other disease: Secondary | ICD-10-CM | POA: Insufficient documentation

## 2020-10-03 ENCOUNTER — Other Ambulatory Visit: Payer: Self-pay

## 2020-10-03 ENCOUNTER — Ambulatory Visit (INDEPENDENT_AMBULATORY_CARE_PROVIDER_SITE_OTHER): Payer: Medicaid Other | Admitting: Advanced Practice Midwife

## 2020-10-03 ENCOUNTER — Other Ambulatory Visit: Payer: Medicaid Other

## 2020-10-03 VITALS — BP 117/76 | HR 83 | Wt 183.2 lb

## 2020-10-03 DIAGNOSIS — Z131 Encounter for screening for diabetes mellitus: Secondary | ICD-10-CM | POA: Diagnosis not present

## 2020-10-03 DIAGNOSIS — Z3400 Encounter for supervision of normal first pregnancy, unspecified trimester: Secondary | ICD-10-CM

## 2020-10-03 DIAGNOSIS — Z3482 Encounter for supervision of other normal pregnancy, second trimester: Secondary | ICD-10-CM | POA: Diagnosis not present

## 2020-10-03 DIAGNOSIS — O99013 Anemia complicating pregnancy, third trimester: Secondary | ICD-10-CM

## 2020-10-03 DIAGNOSIS — Z113 Encounter for screening for infections with a predominantly sexual mode of transmission: Secondary | ICD-10-CM | POA: Diagnosis not present

## 2020-10-03 DIAGNOSIS — Z3A27 27 weeks gestation of pregnancy: Secondary | ICD-10-CM

## 2020-10-03 NOTE — Progress Notes (Signed)
   PRENATAL VISIT NOTE  Subjective:  Natalie [name] is a 20 y.o. G1P0 at [redacted]w[redacted]d being seen today for ongoing prenatal care.  She is currently monitored for the following issues for this low-risk pregnancy and has Asthma, mild intermittent, well-controlled; Allergic rhinitis; Body mass index, pediatric, 85th percentile to less than 95th percentile for age; Adjustment disorder with depressed mood; Parent-child relational problem; Dysmenorrhea; Hidradenitis suppurativa; Supervision of normal first pregnancy, antepartum; and Asthma affecting pregnancy in first trimester on their problem list.  Patient reports no complaints.  Contractions: Not present. Vag. Bleeding: None.  Movement: Present. Denies leaking of fluid.   The following portions of the patient's history were reviewed and updated as appropriate: allergies, current medications, past family history, past medical history, past social history, past surgical history and problem list.   Objective:   Vitals:   10/03/20 1103  BP: 117/76  Pulse: 83  Weight: 183 lb 3.2 oz (83.1 kg)    Fetal Status: Fetal Heart Rate (bpm): 147   Movement: Present     General:  Alert, oriented and cooperative. Patient is in no acute distress.  Skin: Skin is warm and dry. No rash noted.   Cardiovascular: Normal heart rate noted  Respiratory: Normal respiratory effort, no problems with respiration noted  Abdomen: Soft, gravid, appropriate for gestational age.  Pain/Pressure: Absent     Pelvic: Cervical exam deferred        Extremities: Normal range of motion.  Edema: None  Mental Status: Normal mood and affect. Normal behavior. Normal judgment and thought content.   Assessment and Plan:  Pregnancy: G1P0 at [redacted]w[redacted]d 1. [redacted] weeks gestation of pregnancy  - Glucose Tolerance, 2 Hours w/1 Hour - RPR - HIV Antibody (routine testing w rflx) - CBC - Tdap vaccine greater than or equal to 7yo IM   2. Supervision of normal first pregnancy,  antepartum --Anticipatory guidance about next visits/weeks of pregnancy given. --Next visit in 2 weeks, pt prefers in office  Preterm labor symptoms and general obstetric precautions including but not limited to vaginal bleeding, contractions, leaking of fluid and fetal movement were reviewed in detail with the patient. Please refer to After Visit Summary for other counseling recommendations.   Return in about 2 weeks (around 10/17/2020).  No future appointments.  Sharen Counter, CNM

## 2020-10-03 NOTE — Progress Notes (Signed)
Decline tdap  

## 2020-10-04 DIAGNOSIS — O99013 Anemia complicating pregnancy, third trimester: Secondary | ICD-10-CM

## 2020-10-04 HISTORY — DX: Anemia complicating pregnancy, third trimester: O99.013

## 2020-10-04 LAB — CBC
Hematocrit: 31.7 % — ABNORMAL LOW (ref 34.0–46.6)
Hemoglobin: 10.3 g/dL — ABNORMAL LOW (ref 11.1–15.9)
MCH: 28.6 pg (ref 26.6–33.0)
MCHC: 32.5 g/dL (ref 31.5–35.7)
MCV: 88 fL (ref 79–97)
Platelets: 334 10*3/uL (ref 150–450)
RBC: 3.6 x10E6/uL — ABNORMAL LOW (ref 3.77–5.28)
RDW: 13.4 % (ref 11.7–15.4)
WBC: 9.7 10*3/uL (ref 3.4–10.8)

## 2020-10-04 LAB — RPR: RPR Ser Ql: NONREACTIVE

## 2020-10-04 LAB — GLUCOSE TOLERANCE, 2 HOURS W/ 1HR
Glucose, 1 hour: 145 mg/dL (ref 65–179)
Glucose, 2 hour: 110 mg/dL (ref 65–152)
Glucose, Fasting: 82 mg/dL (ref 65–91)

## 2020-10-04 LAB — HIV ANTIBODY (ROUTINE TESTING W REFLEX): HIV Screen 4th Generation wRfx: NONREACTIVE

## 2020-10-04 MED ORDER — FERROUS SULFATE 325 (65 FE) MG PO TABS: 325.0000 mg | ORAL_TABLET | ORAL | 5 refills | Status: DC

## 2020-10-04 NOTE — Addendum Note (Signed)
Addended by: Sharen Counter A on: 10/04/2020 05:12 PM   Modules accepted: Orders

## 2020-10-17 ENCOUNTER — Other Ambulatory Visit: Payer: Self-pay

## 2020-10-17 ENCOUNTER — Ambulatory Visit (INDEPENDENT_AMBULATORY_CARE_PROVIDER_SITE_OTHER): Payer: Medicaid Other | Admitting: Obstetrics and Gynecology

## 2020-10-17 VITALS — BP 107/64 | HR 82 | Wt 187.0 lb

## 2020-10-17 DIAGNOSIS — Z3400 Encounter for supervision of normal first pregnancy, unspecified trimester: Secondary | ICD-10-CM

## 2020-10-17 DIAGNOSIS — Z3A29 29 weeks gestation of pregnancy: Secondary | ICD-10-CM

## 2020-10-17 DIAGNOSIS — J4520 Mild intermittent asthma, uncomplicated: Secondary | ICD-10-CM

## 2020-10-17 DIAGNOSIS — J45909 Unspecified asthma, uncomplicated: Secondary | ICD-10-CM

## 2020-10-17 DIAGNOSIS — O99519 Diseases of the respiratory system complicating pregnancy, unspecified trimester: Secondary | ICD-10-CM

## 2020-10-17 MED ORDER — ALBUTEROL SULFATE HFA 108 (90 BASE) MCG/ACT IN AERS: INHALATION_SPRAY | RESPIRATORY_TRACT | 1 refills | Status: DC

## 2020-10-17 NOTE — Progress Notes (Signed)
ROB c/o feet hurting 5/10 because she stands at work.

## 2020-10-17 NOTE — Progress Notes (Signed)
   LOW-RISK PREGNANCY OFFICE VISIT Patient name: Natalie [name] MRN [id number]  Date of birth: 28-Sep-2000 Chief Complaint:   Routine Prenatal Visit  History of Present Illness:   Natalie [name] is a 20 y.o. G1P0 female at [redacted]w[redacted]d with an Estimated Date of Delivery: 12/29/20 being seen today for ongoing management of a low-risk pregnancy.  Today she reports feet hurting when having to wear sneakers, so she has been wearing Crocs even though it is against her job's policy. She also reports feeling more SOB when doing small activities; requests refill on Albuterol inhaler. Contractions: Not present. Vag. Bleeding: None.  Movement: Present. denies leaking of fluid. Review of Systems:   Pertinent items are noted in HPI Denies abnormal vaginal discharge w/ itching/odor/irritation, headaches, visual changes, shortness of breath, chest pain, abdominal pain, severe nausea/vomiting, or problems with urination or bowel movements unless otherwise stated above. Pertinent History Reviewed:  Reviewed past medical,surgical, social, obstetrical and family history.  Reviewed problem list, medications and allergies. Physical Assessment:   Vitals:   10/17/20 1039  BP: 107/64  Pulse: 82  Weight: 187 lb (84.8 kg)  Body mass index is 35.33 kg/m.        Physical Examination:   General appearance: Well appearing, and in no distress  Mental status: Alert, oriented to person, place, and time  Skin: Warm & dry  Cardiovascular: Normal heart rate noted  Respiratory: Normal respiratory effort, no distress  Abdomen: Soft, gravid, nontender  Pelvic: Cervical exam deferred         Extremities: Edema: None  Fetal Status: Fetal Heart Rate (bpm): 154 Fundal Height: 29 cm Movement: Present    No results found for this or any previous visit (from the past 24 hour(s)).  Assessment & Plan:  1) Low-risk pregnancy G1P0 at [redacted]w[redacted]d with an Estimated Date of Delivery: 12/29/20   2) Asthma affecting pregnancy, antepartum -  albuterol (VENTOLIN HFA) 108 (90 Base) MCG/ACT inhaler; 2 puffs every 4-6 hours when wheezing  Dispense: 1 each; Refill: 1  3) Supervision of normal first pregnancy, antepartum - Letter given stating patient should be allowed to wear Crocs or other shoes for comfort during pregnancy while at work - Discussed normal GTT results - Not taking bASA -- advised should be taking it daily - Continue taking daily iron supplements - Anticipatory guidance for nv being MyChart  4) [redacted] weeks gestation of pregnancy    Meds:  Meds ordered this encounter  Medications  . albuterol (VENTOLIN HFA) 108 (90 Base) MCG/ACT inhaler    Sig: 2 puffs every 4-6 hours when wheezing    Dispense:  1 each    Refill:  1    Order Specific Question:   Supervising Provider    Answer:   Reva Bores [[id number]]   Labs/procedures today: none  Plan:  Continue routine obstetrical care   Reviewed: Preterm labor symptoms and general obstetric precautions including but not limited to vaginal bleeding, contractions, leaking of fluid and fetal movement were reviewed in detail with the patient.  All questions were answered. Has home bp cuff. Check bp weekly, let us know if >140/90.   Follow-up: Return in about 2 weeks (around 10/31/2020) for Return OB - My Chart video.  No orders of the defined types were placed in this encounter.  Raelyn Mora MSN, CNM 10/17/2020 11:50 AM

## 2020-10-18 ENCOUNTER — Encounter: Payer: Medicaid Other | Admitting: Licensed Clinical Social Worker

## 2020-10-18 ENCOUNTER — Telehealth: Payer: Self-pay | Admitting: Licensed Clinical Social Worker

## 2020-10-18 NOTE — Telephone Encounter (Signed)
Called pt regarding schedule mychart visit. Left message for callback  

## 2020-10-31 ENCOUNTER — Telehealth (INDEPENDENT_AMBULATORY_CARE_PROVIDER_SITE_OTHER): Payer: Medicaid Other | Admitting: Advanced Practice Midwife

## 2020-10-31 DIAGNOSIS — Z3400 Encounter for supervision of normal first pregnancy, unspecified trimester: Secondary | ICD-10-CM

## 2020-10-31 NOTE — Progress Notes (Signed)
Called and sent text to patient about virtual visit. No answer.

## 2020-11-01 ENCOUNTER — Telehealth: Payer: Self-pay

## 2020-11-01 NOTE — Telephone Encounter (Signed)
We can't rx meds for pregnant patients. She should discuss with OB.

## 2020-11-01 NOTE — Telephone Encounter (Signed)
Dalonda left message on nurse line requesting new RX for cetirizine, no pharmacy information provided. Last saw PCP 05/20/17, cetirizine last prescribed by C. Hacker 05/21/19. Oluchi has also seen Family Medicine and is currently seeing OB/GYN for pregnancy care.

## 2020-11-01 NOTE — Telephone Encounter (Signed)
I called number on file but out of service at this time; MyChart message sent.

## 2020-11-02 NOTE — Telephone Encounter (Signed)
Number on file we have listed for Natalie [name] is no longer in service. MyChart message has been sent requesting Natalie [name] reach out to her OB for request for allergy medication.

## 2020-11-02 NOTE — Telephone Encounter (Signed)
Please advice patient to contact OB & also transition care to family practice. Thank you!  Tobey Bride, MD Pediatrician [facility] [address] [place], [place] [address] Ph: [phone] Fax: [phone] 11/02/2020 1:39 PM

## 2020-11-07 ENCOUNTER — Telehealth (INDEPENDENT_AMBULATORY_CARE_PROVIDER_SITE_OTHER): Payer: Medicaid Other | Admitting: Advanced Practice Midwife

## 2020-11-07 VITALS — BP 122/70 | HR 80

## 2020-11-07 DIAGNOSIS — Z3400 Encounter for supervision of normal first pregnancy, unspecified trimester: Secondary | ICD-10-CM

## 2020-11-07 DIAGNOSIS — M5431 Sciatica, right side: Secondary | ICD-10-CM

## 2020-11-07 DIAGNOSIS — R252 Cramp and spasm: Secondary | ICD-10-CM

## 2020-11-07 DIAGNOSIS — O99891 Other specified diseases and conditions complicating pregnancy: Secondary | ICD-10-CM | POA: Insufficient documentation

## 2020-11-07 DIAGNOSIS — Z3A32 32 weeks gestation of pregnancy: Secondary | ICD-10-CM

## 2020-11-07 DIAGNOSIS — O99513 Diseases of the respiratory system complicating pregnancy, third trimester: Secondary | ICD-10-CM

## 2020-11-07 DIAGNOSIS — J4520 Mild intermittent asthma, uncomplicated: Secondary | ICD-10-CM

## 2020-11-07 HISTORY — DX: Sciatica, right side: M54.31

## 2020-11-07 NOTE — Progress Notes (Signed)
Virtual ROB [redacted]w[redacted]d  CC: pt notes while working yesterday she had discomfort in right leg was hard to stand .    Pt not able to check B/P at this time.

## 2020-11-07 NOTE — Progress Notes (Signed)
OBSTETRICS PRENATAL VIRTUAL VISIT ENCOUNTER NOTE  Provider location: [facility]   Patient location: Home  I connected with Natalie [name] on 11/07/20 at  8:55 AM EDT by MyChart Video Encounter and verified that I am speaking with the correct person using two identifiers. I discussed the limitations, risks, security and privacy concerns of performing an evaluation and management service virtually and the availability of in person appointments. I also discussed with the patient that there may be a patient responsible charge related to this service. The patient expressed understanding and agreed to proceed. Subjective:  Natalie [name] is a 20 y.o. G1P0 at [redacted]w[redacted]d being seen today for ongoing prenatal care.  She is currently monitored for the following issues for this low-risk pregnancy and has Asthma, mild intermittent, well-controlled; Allergic rhinitis; Body mass index, pediatric, 85th percentile to less than 95th percentile for age; Adjustment disorder with depressed mood; Parent-child relational problem; Dysmenorrhea; Hidradenitis suppurativa; Supervision of normal first pregnancy, antepartum; Asthma affecting pregnancy in first trimester; and Anemia affecting pregnancy in third trimester on their problem list.  Patient reports leg cramps at night occasionally then episode of numbness in right leg radiating down leg.  Contractions: Not present. Vag. Bleeding: None.  Movement: Present. Denies any leaking of fluid.   The following portions of the patient's history were reviewed and updated as appropriate: allergies, current medications, past family history, past medical history, past social history, past surgical history and problem list.   Objective:   Vitals:   11/07/20 0902  BP: 122/70  Pulse: 80    Fetal Status:     Movement: Present     General:  Alert, oriented and cooperative. Patient is in no acute distress.  Respiratory: Normal respiratory effort, no  problems with respiration noted  Mental Status: Normal mood and affect. Normal behavior. Normal judgment and thought content.  Rest of physical exam deferred due to type of encounter  Imaging: No results found.  Assessment and Plan:  Pregnancy: G1P0 at [redacted]w[redacted]d 1. Supervision of normal first pregnancy, antepartum --Anticipatory guidance about next visits/weeks of pregnancy given. --Pt found BP cuff and took BP during today's virtual visit and wnl at 122/70 --Next visit in 2 weeks in office  2. Asthma, mild intermittent, well-controlled   3. [redacted] weeks gestation of pregnancy   4. Sciatica of right side --Pain/numbness in right leg, occurred after standing at work yesterday. --Now resolved --No swelling, no warmth in right leg per pt --Likely sciatica, reviewed treatments, including ice, rest, position change, and pregnancy support belt. --My chart message sent with sciatica information  5. Leg cramps in pregnancy --Pt reports occasional sharp sudden tightening pains in bilateral calves that wakes her up at night.  Has happened 1-2 times. --Heat, increase PO fluids, consider magnesium supplement but not good data --Notify office if worsening  Preterm labor symptoms and general obstetric precautions including but not limited to vaginal bleeding, contractions, leaking of fluid and fetal movement were reviewed in detail with the patient. I discussed the assessment and treatment plan with the patient. The patient was provided an opportunity to ask questions and all were answered. The patient agreed with the plan and demonstrated an understanding of the instructions. The patient was advised to call back or seek an in-person office evaluation/go to [facility] at [facility] for any urgent or concerning symptoms. Please refer to After Visit Summary for other counseling recommendations.   I provided 10 minutes of face-to-face time during this encounter.  Return in about 2 weeks  (around 11/21/2020).  No future appointments.  Sharen Counter, CNM [facility] [employer], [facility]

## 2020-11-21 ENCOUNTER — Ambulatory Visit (INDEPENDENT_AMBULATORY_CARE_PROVIDER_SITE_OTHER): Payer: Medicaid Other | Admitting: Advanced Practice Midwife

## 2020-11-21 ENCOUNTER — Other Ambulatory Visit: Payer: Self-pay

## 2020-11-21 VITALS — BP 103/67 | HR 101 | Wt 191.0 lb

## 2020-11-21 DIAGNOSIS — Z3400 Encounter for supervision of normal first pregnancy, unspecified trimester: Secondary | ICD-10-CM

## 2020-11-21 DIAGNOSIS — Z3A34 34 weeks gestation of pregnancy: Secondary | ICD-10-CM

## 2020-11-21 NOTE — Progress Notes (Signed)
   PRENATAL VISIT NOTE  Subjective:  Natalie [name] is a 20 y.o. G1P0 at [redacted]w[redacted]d being seen today for ongoing prenatal care.  She is currently monitored for the following issues for this low-risk pregnancy and has Asthma, mild intermittent, well-controlled; Allergic rhinitis; Dysmenorrhea; Hidradenitis suppurativa; Supervision of normal first pregnancy, antepartum; Asthma affecting pregnancy in first trimester; Anemia affecting pregnancy in third trimester; Sciatica of right side; and Leg cramps in pregnancy on their problem list.  Patient reports no complaints.  Contractions: Not present. Vag. Bleeding: None.  Movement: Present. Denies leaking of fluid.   The following portions of the patient's history were reviewed and updated as appropriate: allergies, current medications, past family history, past medical history, past social history, past surgical history and problem list.   Objective:   Vitals:   11/21/20 1004  BP: 103/67  Pulse: (!) 101  Weight: 191 lb (86.6 kg)    Fetal Status: Fetal Heart Rate (bpm): 145 Fundal Height: 34 cm Movement: Present     General:  Alert, oriented and cooperative. Patient is in no acute distress.  Skin: Skin is warm and dry. No rash noted.   Cardiovascular: Normal heart rate noted  Respiratory: Normal respiratory effort, no problems with respiration noted  Abdomen: Soft, gravid, appropriate for gestational age.  Pain/Pressure: Present     Pelvic: Cervical exam deferred        Extremities: Normal range of motion.  Edema: Trace  Mental Status: Normal mood and affect. Normal behavior. Normal judgment and thought content.   Assessment and Plan:  Pregnancy: G1P0 at [redacted]w[redacted]d 1. Supervision of normal first pregnancy, antepartum --Anticipatory guidance about next visits/weeks of pregnancy given. --Next visit in 2 weeks  2. [redacted] weeks gestation of pregnancy   Preterm labor symptoms and general obstetric precautions including but not limited to vaginal  bleeding, contractions, leaking of fluid and fetal movement were reviewed in detail with the patient. Please refer to After Visit Summary for other counseling recommendations.   Return in about 2 weeks (around 12/05/2020).  Future Appointments  Date Time Provider Department Center  12/05/2020 10:45 AM Constant, Gigi Gin, MD [facility] None    Sharen Counter, CNM

## 2020-11-21 NOTE — Patient Instructions (Signed)

## 2020-12-05 ENCOUNTER — Other Ambulatory Visit: Payer: Self-pay

## 2020-12-05 ENCOUNTER — Ambulatory Visit (INDEPENDENT_AMBULATORY_CARE_PROVIDER_SITE_OTHER): Payer: Medicaid Other | Admitting: Obstetrics and Gynecology

## 2020-12-05 ENCOUNTER — Other Ambulatory Visit (HOSPITAL_COMMUNITY)
Admission: RE | Admit: 2020-12-05 | Discharge: 2020-12-05 | Disposition: A | Discharge status: Home / Self Care | Payer: Medicaid Other | Source: Ambulatory Visit | Attending: Obstetrics and Gynecology | Admitting: Obstetrics and Gynecology

## 2020-12-05 ENCOUNTER — Encounter: Payer: Self-pay | Admitting: Obstetrics and Gynecology

## 2020-12-05 VITALS — BP 118/69 | HR 85 | Wt 189.0 lb

## 2020-12-05 DIAGNOSIS — O99013 Anemia complicating pregnancy, third trimester: Secondary | ICD-10-CM

## 2020-12-05 DIAGNOSIS — Z3400 Encounter for supervision of normal first pregnancy, unspecified trimester: Secondary | ICD-10-CM | POA: Diagnosis not present

## 2020-12-05 DIAGNOSIS — M5431 Sciatica, right side: Secondary | ICD-10-CM

## 2020-12-05 NOTE — Progress Notes (Signed)
ROB [redacted]w[redacted]d GBS due today.  CC: Itching on skin has not tried OTC creams or aveeno, pt notes upper abdominal  discofort from baby balling up.   Pt declines cervix check today.

## 2020-12-05 NOTE — Progress Notes (Signed)
   PRENATAL VISIT NOTE  Subjective:  Natalie [name] is a 20 y.o. G1P0 at [redacted]w[redacted]d being seen today for ongoing prenatal care.  She is currently monitored for the following issues for this low-risk pregnancy and has Asthma, mild intermittent, well-controlled; Allergic rhinitis; Dysmenorrhea; Hidradenitis suppurativa; Supervision of normal first pregnancy, antepartum; Asthma affecting pregnancy in first trimester; Anemia affecting pregnancy in third trimester; Sciatica of right side; and Leg cramps in pregnancy on their problem list.  Patient reports no complaints.  Contractions: Not present. Vag. Bleeding: None.  Movement: Present. Denies leaking of fluid.   The following portions of the patient's history were reviewed and updated as appropriate: allergies, current medications, past family history, past medical history, past social history, past surgical history and problem list.   Objective:   Vitals:   12/05/20 1051  BP: 118/69  Pulse: 85  Weight: 189 lb (85.7 kg)    Fetal Status: Fetal Heart Rate (bpm): 140 Fundal Height: 36 cm Movement: Present  Presentation: Vertex  General:  Alert, oriented and cooperative. Patient is in no acute distress.  Skin: Skin is warm and dry. No rash noted.   Cardiovascular: Normal heart rate noted  Respiratory: Normal respiratory effort, no problems with respiration noted  Abdomen: Soft, gravid, appropriate for gestational age.  Pain/Pressure: Present     Pelvic: Cervical exam performed in the presence of a chaperone Dilation: 1.5 Effacement (%): 50 Station: -3  Extremities: Normal range of motion.  Edema: None  Mental Status: Normal mood and affect. Normal behavior. Normal judgment and thought content.   Assessment and Plan:  Pregnancy: G1P0 at [redacted]w[redacted]d 1. Supervision of normal first pregnancy, antepartum Patient is doing well without complaints Patient is still researching pediatrician Cultures today  2. Anemia affecting pregnancy in third  trimester Continue iron supplement  3. Sciatica of right side   Preterm labor symptoms and general obstetric precautions including but not limited to vaginal bleeding, contractions, leaking of fluid and fetal movement were reviewed in detail with the patient. Please refer to After Visit Summary for other counseling recommendations.   Return in about 1 week (around 12/12/2020) for in person, ROB, Low risk.  No future appointments.  Catalina Antigua, MD

## 2020-12-06 LAB — CERVICOVAGINAL ANCILLARY ONLY
Chlamydia: NEGATIVE
Comment: NEGATIVE
Comment: NORMAL
Neisseria Gonorrhea: NEGATIVE

## 2020-12-09 ENCOUNTER — Inpatient Hospital Stay (HOSPITAL_COMMUNITY)
Admission: AD | Admit: 2020-12-09 | Discharge: 2020-12-09 | Disposition: A | Discharge status: Home / Self Care | Payer: Medicaid Other | Source: Home / Self Care | Attending: Obstetrics and Gynecology | Admitting: Obstetrics and Gynecology

## 2020-12-09 ENCOUNTER — Other Ambulatory Visit: Payer: Self-pay

## 2020-12-09 ENCOUNTER — Encounter (HOSPITAL_COMMUNITY): Payer: Self-pay | Admitting: Obstetrics & Gynecology

## 2020-12-09 ENCOUNTER — Inpatient Hospital Stay (HOSPITAL_COMMUNITY): Payer: Medicaid Other | Admitting: Anesthesiology

## 2020-12-09 ENCOUNTER — Inpatient Hospital Stay (HOSPITAL_COMMUNITY)
Admission: AD | Admit: 2020-12-09 | Discharge: 2020-12-13 | DRG: 788 | Disposition: A | Discharge status: Home / Self Care | Payer: Medicaid Other | Attending: Obstetrics & Gynecology | Admitting: Obstetrics & Gynecology

## 2020-12-09 ENCOUNTER — Encounter (HOSPITAL_COMMUNITY): Payer: Self-pay | Admitting: Obstetrics and Gynecology

## 2020-12-09 DIAGNOSIS — Z975 Presence of (intrauterine) contraceptive device: Secondary | ICD-10-CM

## 2020-12-09 DIAGNOSIS — O99013 Anemia complicating pregnancy, third trimester: Secondary | ICD-10-CM | POA: Diagnosis present

## 2020-12-09 DIAGNOSIS — O9952 Diseases of the respiratory system complicating childbirth: Secondary | ICD-10-CM | POA: Diagnosis not present

## 2020-12-09 DIAGNOSIS — Z30430 Encounter for insertion of intrauterine contraceptive device: Secondary | ICD-10-CM

## 2020-12-09 DIAGNOSIS — O99214 Obesity complicating childbirth: Secondary | ICD-10-CM | POA: Diagnosis not present

## 2020-12-09 DIAGNOSIS — J45909 Unspecified asthma, uncomplicated: Secondary | ICD-10-CM | POA: Diagnosis present

## 2020-12-09 DIAGNOSIS — D649 Anemia, unspecified: Secondary | ICD-10-CM | POA: Diagnosis not present

## 2020-12-09 DIAGNOSIS — O9902 Anemia complicating childbirth: Secondary | ICD-10-CM | POA: Diagnosis not present

## 2020-12-09 DIAGNOSIS — O321XX0 Maternal care for breech presentation, not applicable or unspecified: Secondary | ICD-10-CM | POA: Diagnosis not present

## 2020-12-09 DIAGNOSIS — O479 False labor, unspecified: Secondary | ICD-10-CM | POA: Insufficient documentation

## 2020-12-09 DIAGNOSIS — Z30014 Encounter for initial prescription of intrauterine contraceptive device: Secondary | ICD-10-CM | POA: Diagnosis not present

## 2020-12-09 DIAGNOSIS — Z87891 Personal history of nicotine dependence: Secondary | ICD-10-CM | POA: Diagnosis not present

## 2020-12-09 DIAGNOSIS — Z3A37 37 weeks gestation of pregnancy: Secondary | ICD-10-CM | POA: Diagnosis not present

## 2020-12-09 DIAGNOSIS — Z20822 Contact with and (suspected) exposure to covid-19: Secondary | ICD-10-CM | POA: Diagnosis present

## 2020-12-09 DIAGNOSIS — O26893 Other specified pregnancy related conditions, third trimester: Secondary | ICD-10-CM | POA: Diagnosis not present

## 2020-12-09 DIAGNOSIS — J4520 Mild intermittent asthma, uncomplicated: Secondary | ICD-10-CM | POA: Diagnosis present

## 2020-12-09 DIAGNOSIS — Z3400 Encounter for supervision of normal first pregnancy, unspecified trimester: Secondary | ICD-10-CM

## 2020-12-09 LAB — CBC
HCT: 34.5 % — ABNORMAL LOW (ref 36.0–46.0)
Hemoglobin: 11.1 g/dL — ABNORMAL LOW (ref 12.0–15.0)
MCH: 27.2 pg (ref 26.0–34.0)
MCHC: 32.2 g/dL (ref 30.0–36.0)
MCV: 84.6 fL (ref 80.0–100.0)
Platelets: 356 10*3/uL (ref 150–400)
RBC: 4.08 MIL/uL (ref 3.87–5.11)
RDW: 14.8 % (ref 11.5–15.5)
WBC: 10.9 10*3/uL — ABNORMAL HIGH (ref 4.0–10.5)
nRBC: 0 % (ref 0.0–0.2)

## 2020-12-09 LAB — TYPE AND SCREEN
ABO/RH(D): O POS
Antibody Screen: NEGATIVE

## 2020-12-09 LAB — RESP PANEL BY RT-PCR (FLU A&B, COVID) ARPGX2
Influenza A by PCR: NEGATIVE
Influenza B by PCR: NEGATIVE
SARS Coronavirus 2 by RT PCR: NEGATIVE

## 2020-12-09 LAB — POCT FERN TEST: POCT Fern Test: POSITIVE

## 2020-12-09 LAB — CULTURE, BETA STREP (GROUP B ONLY): Strep Gp B Culture: NEGATIVE

## 2020-12-09 MED ORDER — PHENYLEPHRINE 40 MCG/ML (10ML) SYRINGE FOR IV PUSH (FOR BLOOD PRESSURE SUPPORT): 80.0000 ug | PREFILLED_SYRINGE | INTRAVENOUS | Status: DC | PRN

## 2020-12-09 MED ORDER — EPHEDRINE 5 MG/ML INJ: 10.0000 mg | INTRAVENOUS | Status: DC | PRN

## 2020-12-09 MED ORDER — FENTANYL-BUPIVACAINE-NACL 0.5-0.125-0.9 MG/250ML-% EP SOLN
EPIDURAL | Status: DC | PRN
  Administered 2020-12-09: 12 mL/h via EPIDURAL

## 2020-12-09 MED ORDER — OXYCODONE-ACETAMINOPHEN 5-325 MG PO TABS
2.0000 | ORAL_TABLET | ORAL | Status: DC | PRN
Start: 2020-12-09 — End: 2020-12-10

## 2020-12-09 MED ORDER — ACETAMINOPHEN 325 MG PO TABS: 650.0000 mg | ORAL_TABLET | ORAL | Status: DC | PRN

## 2020-12-09 MED ORDER — OXYTOCIN-SODIUM CHLORIDE 30-0.9 UT/500ML-% IV SOLN: 2.5000 [IU]/h | INTRAVENOUS | Status: DC

## 2020-12-09 MED ORDER — DIPHENHYDRAMINE HCL 50 MG/ML IJ SOLN: 12.5000 mg | INTRAMUSCULAR | Status: DC | PRN

## 2020-12-09 MED ORDER — LIDOCAINE HCL (PF) 1 % IJ SOLN: 30.0000 mL | INTRAMUSCULAR | Status: DC | PRN

## 2020-12-09 MED ORDER — OXYTOCIN BOLUS FROM INFUSION: 333.0000 mL | Freq: Once | INTRAVENOUS | Status: DC

## 2020-12-09 MED ORDER — FENTANYL CITRATE (PF) 100 MCG/2ML IJ SOLN: 50.0000 ug | INTRAMUSCULAR | Status: DC | PRN

## 2020-12-09 MED ORDER — LACTATED RINGERS IV SOLN: INTRAVENOUS | Status: DC

## 2020-12-09 MED ORDER — LIDOCAINE HCL (PF) 1 % IJ SOLN
INTRAMUSCULAR | Status: DC | PRN
  Administered 2020-12-09: 2 mL via EPIDURAL
  Administered 2020-12-09: 10 mL via EPIDURAL

## 2020-12-09 MED ORDER — LACTATED RINGERS IV SOLN: 500.0000 mL | Freq: Once | INTRAVENOUS | Status: DC

## 2020-12-09 MED ORDER — SOD CITRATE-CITRIC ACID 500-334 MG/5ML PO SOLN
30.0000 mL | ORAL | Status: DC | PRN
  Administered 2020-12-10: 30 mL via ORAL
  Filled 2020-12-09: qty 15

## 2020-12-09 MED ORDER — LEVONORGESTREL 20.1 MCG/DAY IU IUD
1.0000 | INTRAUTERINE_SYSTEM | Freq: Once | INTRAUTERINE | Status: AC
  Administered 2020-12-10: 1 via INTRAUTERINE

## 2020-12-09 MED ORDER — OXYCODONE-ACETAMINOPHEN 5-325 MG PO TABS: 1.0000 | ORAL_TABLET | ORAL | Status: DC | PRN

## 2020-12-09 MED ORDER — FENTANYL-BUPIVACAINE-NACL 0.5-0.125-0.9 MG/250ML-% EP SOLN
12.0000 mL/h | EPIDURAL | Status: DC | PRN
Start: 2020-12-09 — End: 2020-12-10
  Filled 2020-12-09: qty 250

## 2020-12-09 MED ORDER — ONDANSETRON HCL 4 MG/2ML IJ SOLN: 4.0000 mg | Freq: Four times a day (QID) | INTRAMUSCULAR | Status: DC | PRN

## 2020-12-09 MED ORDER — LACTATED RINGERS IV SOLN: 500.0000 mL | INTRAVENOUS | Status: DC | PRN

## 2020-12-09 NOTE — Discharge Instructions (Signed)
Rosen's Emergency Medicine: Concepts and Clinical Practice (9th ed., pp. 2296- 2312). Elsevier.">  Braxton Hicks Contractions Contractions of the uterus can occur throughout pregnancy, but they are not always a sign that you are in labor. You may have practice contractions called Braxton Hicks contractions. These false labor contractions are sometimes confused with true labor. What are Braxton Hicks contractions? Braxton Hicks contractions are tightening movements that occur in the muscles of the uterus before labor. Unlike true labor contractions, these contractions do not result in opening (dilation) and thinning of the cervix. Toward the end of pregnancy (32-34 weeks), Braxton Hicks contractions can happen more often and may become stronger. These contractions are sometimes difficult to tell apart from true labor because they can be very uncomfortable. You should not feel embarrassed if you go to the hospital with false labor. Sometimes, the only way to tell if you are in true labor is for your health care provider to look for changes in the cervix. The health care provider will do a physical exam and may monitor your contractions. If you are not in true labor, the exam should show that your cervix is not dilating and your water has not broken. If there are no other health problems associated with your pregnancy, it is completely safe for you to be sent home with false labor. You may continue to have Braxton Hicks contractions until you go into true labor. How to tell the difference between true labor and false labor True labor  Contractions last 30-70 seconds.  Contractions become very regular.  Discomfort is usually felt in the top of the uterus, and it spreads to the lower abdomen and low back.  Contractions do not go away with walking.  Contractions usually become more intense and increase in frequency.  The cervix dilates and gets thinner. False labor  Contractions are usually shorter  and not as strong as true labor contractions.  Contractions are usually irregular.  Contractions are often felt in the front of the lower abdomen and in the groin.  Contractions may go away when you walk around or change positions while lying down.  Contractions get weaker and are shorter-lasting as time goes on.  The cervix usually does not dilate or become thin. Follow these instructions at home:  Take over-the-counter and prescription medicines only as told by your health care provider.  Keep up with your usual exercises and follow other instructions from your health care provider.  Eat and drink lightly if you think you are going into labor.  If Braxton Hicks contractions are making you uncomfortable: ? Change your position from lying down or resting to walking, or change from walking to resting. ? Sit and rest in a tub of warm water. ? Drink enough fluid to keep your urine pale yellow. Dehydration may cause these contractions. ? Do slow and deep breathing several times an hour.  Keep all follow-up prenatal visits as told by your health care provider. This is important.   Contact a health care provider if:  You have a fever.  You have continuous pain in your abdomen. Get help right away if:  Your contractions become stronger, more regular, and closer together.  You have fluid leaking or gushing from your vagina.  You pass blood-tinged mucus (bloody show).  You have bleeding from your vagina.  You have low back pain that you never had before.  You feel your baby's head pushing down and causing pelvic pressure.  Your baby is not moving inside   you as much as it used to. Summary  Contractions that occur before labor are called Braxton Hicks contractions, false labor, or practice contractions.  Braxton Hicks contractions are usually shorter, weaker, farther apart, and less regular than true labor contractions. True labor contractions usually become progressively  stronger and regular, and they become more frequent.  Manage discomfort from [facility] contractions by changing position, resting in a warm bath, drinking plenty of water, or practicing deep breathing. This information is not intended to replace advice given to you by your health care provider. Make sure you discuss any questions you have with your health care provider. Document Revised: 06/28/2017 Document Reviewed: 11/29/2016 Elsevier Patient Education  2021 [employer].    First Stage of Labor Labor is your body's natural process of moving your baby and other structures, including the placenta and umbilical cord, out of your uterus. There are three stages of labor. How long each stage lasts is different for every woman. But certain events happen during each stage that are the same for everyone.  The first stage starts when true labor begins. This stage ends when your cervix, which is the opening from your uterus into your vagina, is completely open (dilated).  The second stage begins when your cervix is fully dilated and you start pushing. This stage ends when your baby is born.  The third stage is the delivery of the organ that nourished your baby during pregnancy (placenta). First stage of labor As your due date gets closer, you may start to notice certain physical changes that mean labor is going to start soon. You may feel that your baby has dropped lower into your pelvis. You may experience irregular, often painless, contractions that go away when you walk around or lie down ([employer] contractions). This is also called false labor. The first stage of labor begins when you start having contractions that come at regular (evenly spaced) intervals and your cervix starts to get thinner and wider in preparation for your baby to pass through. Birth care providers measure the dilation of your cervix in centimeters (cm). One centimeter is a little less than one-half of an inch. The first  stage ends when your cervix is dilated to 10 cm. The first stage of labor is divided into three phases:  Early phase.  Active phase.  Transitional phase. The length of the first stage of labor varies. It may be longer if this is your first pregnancy. You may spend most of this stage at home trying to relax and stay comfortable. How does this affect me? During the first stage of labor, you will move through three phases. What happens in the early phase?  You will start to have regular contractions that last 30-60 seconds. Contractions may come every 5-20 minutes. Keep track of your contractions and call your birth care provider.  Your water may break during this phase.  You may notice a clear or slightly bloody discharge of mucus (mucus plug) from your vagina.  Your cervix will dilate to 3-6 cm. What happens in the active phase? The active phase usually lasts 3-5 hours. You may go to the hospital or birth center around this time. During the active phase:  Your contractions will become stronger, longer, and more uncomfortable.  Your contractions may last 45-90 seconds and come every 3-5 minutes.  You may feel lower back pain.  Your birth care providers may examine your cervix and feel your belly to find the position of your baby.  You may have a monitor strapped to your belly to measure your contractions and your baby's heart rate.  You may start using your pain management options.  Your cervix may be dilated to 6 cm and may start to dilate more quickly. What happens in the transitional phase? The transitional phase typically lasts from 30 minutes to 2 hours. At the end of this phase, your cervix will be fully dilated to 10 cm. During the transitional phase:  Contractions will get stronger and longer.  Contractions may last 60-90 seconds and come less than 2 minutes apart.  You may feel hot flashes, chills, or nausea. How does this affect my baby? During the first stage of  labor, your baby will gradually move down into your birth canal. Follow these instructions at home and in the hospital or birth center:  When labor first begins, try to stay calm. You are still in the early phase. If it is night, try to get some sleep. If it is day, try to relax and save your energy. You may want to make some calls and get ready to go to the hospital or birth center.  When you are in the early phase, try these methods to help ease discomfort: ? Deep breathing and muscle relaxation. ? Taking a walk. ? Taking a warm bath or shower.  Drink some fluids and have a light snack if you feel like it.  Keep track of your contractions.  Based on the plan you created with your birth care provider, call when your contractions indicate it is time.  If your water breaks, note the time, color, and odor of the fluid.  When you are in the active phase, do your breathing exercises and rely on your support people and your team of birth care providers.   Contact a health care provider if:  Your contractions are strong and regular.  You have lower back pain or cramping.  Your water breaks.  You lose your mucus plug. Get help right away if you:  Have a severe headache that does not go away.  Have changes in your vision.  Have severe pain in your upper belly.  Do not feel the baby move.  Have bright red bleeding. Summary  The first stage of labor starts when true labor begins, and it ends when your cervix is dilated to 10 cm.  The first stage of labor has three phases: early, active, and transitional.  Your baby moves into the birth canal during the first stage of labor.  You may have contractions that become stronger and longer. You may also lose your mucus plug and have your water break.  Call your birth care provider when your contractions are frequent and strong enough to go to the hospital or birth center. This information is not intended to replace advice given to you  by your health care provider. Make sure you discuss any questions you have with your health care provider. Document Revised: 11/06/2018 Document Reviewed: 09/29/2017 Elsevier Patient Education  2021 [employer].

## 2020-12-09 NOTE — Anesthesia Preprocedure Evaluation (Signed)
Anesthesia Evaluation  Patient identified by MRN, date of birth, ID band Patient awake    Reviewed: Allergy & Precautions, Patient's Chart, lab work & pertinent test results  Airway Mallampati: II  TM Distance: >3 FB Neck ROM: Full    Dental no notable dental hx.    Pulmonary asthma , Patient abstained from smoking., former smoker,    Pulmonary exam normal breath sounds clear to auscultation       Cardiovascular negative cardio ROS Normal cardiovascular exam Rhythm:Regular Rate:Normal     Neuro/Psych negative neurological ROS  negative psych ROS   GI/Hepatic negative GI ROS, Neg liver ROS,   Endo/Other  Obesity BMI 36  Renal/GU negative Renal ROS  negative genitourinary   Musculoskeletal negative musculoskeletal ROS (+)   Abdominal (+) + obese,   Peds negative pediatric ROS (+)  Hematology  (+) Blood dyscrasia, anemia , hct 34.5, plt 356   Anesthesia Other Findings   Reproductive/Obstetrics (+) Pregnancy                             Anesthesia Physical Anesthesia Plan  ASA: II and emergent  Anesthesia Plan: Epidural   Post-op Pain Management:    Induction:   PONV Risk Score and Plan: 2  Airway Management Planned: Natural Airway  Additional Equipment: None  Intra-op Plan:   Post-operative Plan:   Informed Consent: I have reviewed the patients History and Physical, chart, labs and discussed the procedure including the risks, benefits and alternatives for the proposed anesthesia with the patient or authorized representative who has indicated his/her understanding and acceptance.       Plan Discussed with:   Anesthesia Plan Comments:         Anesthesia Quick Evaluation

## 2020-12-09 NOTE — H&P (Signed)
OBSTETRIC ADMISSION HISTORY AND PHYSICAL  Natalie [name] is a 20 y.o. female G1P0 with IUP at 26w1dby 10 wk u/s presenting for SOL. She reports +FMs, No LOF, no VB, no blurry vision, headaches or peripheral edema, and RUQ pain.  She plans on breast and bottle feeding. She request post placental liletta for birth control. 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 Allergy   . Asthma   . Tonsillitis   . Trichomoniasis     Past Surgical History: Past Surgical History:  Procedure Laterality Date  . HYDRADENITIS EXCISION Bilateral 12/08/2019   Procedure: WIDE EXCISION BILATERAL AXILLARY HIDRADENITIS;  Surgeon: BCoralie Keens MD;  [facility] [facility]  Service: General;  Laterality: Bilateral;    Obstetrical History: OB History    Gravida  1   Para      Term      Preterm      AB      Living        SAB      IAB      Ectopic      Multiple      Live Births              Social History Social History   Socioeconomic History  . Marital status: Significant Other    Spouse name: Not on file  . Number of children: Not on file  . Years of education: Not on file  . Highest education level: Not on file  Occupational History  . Not on file  Tobacco Use  . Smoking status: Former Smoker    Types: Cigars  . Smokeless tobacco: Never Used  . Tobacco comment: last used 03-24-20  Vaping Use  . Vaping Use: Every day  . Substances: Nicotine  Substance and Sexual Activity  . Alcohol use: Not Currently    Comment: last used 03-23-20  . Drug use: Not Currently    Types: Marijuana    Comment: last used 03-23-20  . Sexual activity:  Yes  Other Topics Concern  . Not on file  Social History Narrative   Lives with mother, her boyfriend and an 139month old brother.  [place]  (Mom [place] in [place]).  [facility] [place] [facility] [place]   Social Determinants of Health   Financial Resource Strain: Not on file  Food Insecurity: Not on file  Transportation Needs: Not on file  Physical Activity: Not on file  Stress: Not on file  Social Connections: Not on file    Family History: Family History  Problem Relation Age of Onset  . Learning disabilities Mother   . Mental illness Mother        Depression  . Diabetes Paternal Aunt   . Asthma Maternal Grandmother   . Hypertension Maternal Grandmother   . Learning disabilities Maternal Grandmother   . Mental illness Maternal Grandmother        Depression  . Stroke Maternal Grandmother   . Hypertension Maternal Grandfather   . Alcohol abuse Maternal Grandfather   . Drug abuse Maternal Grandfather  Allergies: Allergies  Allergen Reactions  . Pollen Extract Other (See Comments)    Seasonal allergies     Medications Prior to Admission  Medication Sig Dispense Refill Last Dose  . ferrous sulfate (FERROUSUL) 325 (65 FE) MG tablet Take 1 tablet (325 mg total) by mouth every other day. 30 tablet 5 12/08/2020 at Unknown time  . Prenatal Vit-Fe Fumarate-FA (PRENATAL MULTIVITAMIN) TABS tablet Take 1 tablet by mouth daily at 12 noon.   12/08/2020 at Unknown time  . albuterol (VENTOLIN HFA) 108 (90 Base) MCG/ACT inhaler 2 puffs every 4-6 hours when wheezing 1 each 1   . aspirin EC 81 MG tablet Take 1 tablet (81 mg total) by mouth daily. Swallow whole. (Patient not taking: No sig reported) 30 tablet 11   . Blood Pressure Monitoring (BLOOD PRESSURE KIT) DEVI 1 kit by Does not apply route as needed. 1 each 0   . cetirizine (ZYRTEC) 10 MG tablet Take one tablet by mouth once daily for allergies 30 tablet 11    . Doxylamine-Pyridoxine (DICLEGIS) 10-10 MG TBEC Take 2 tablets by mouth at bedtime. If symptoms persist, add one tablet in the morning and one in the afternoon (Patient not taking: No sig reported) 100 tablet 5   . promethazine (PHENERGAN) 25 MG tablet Take 1 tablet (25 mg total) by mouth every 6 (six) hours as needed for nausea or vomiting. (Patient not taking: No sig reported) 30 tablet 0      Review of Systems   All systems reviewed and negative except as stated in HPI  Blood pressure 117/76, pulse 84, temperature 98.7 F (37.1 C), temperature source Oral, resp. rate 18, height $RemoveBe'5\' 1"'xVTtLdGwN$  (1.549 m), weight 86.2 kg, last menstrual period 03/24/2020, SpO2 99 %. General appearance: alert, cooperative and no distress Lungs: normal respiratory effort Heart: regular rate and rhythm Abdomen: soft, non-tender; gravid Pelvic: as noted below Extremities: Homans sign is negative, no sign of DVT Presentation: cephalic by MAU RN exam Fetal monitoringBaseline: 130 bpm, Variability: Good {> 6 bpm), Accelerations: Reactive and Decelerations: Absent Uterine activity difficult to trace Dilation: 5 Effacement (%): 70 Station: -2 Exam by:: K.Wilson,RN   Prenatal labs: ABO, Rh: --/--/O POS (05/13 2219) Antibody: NEG (05/13 2219) Rubella: 11.10 (11/29 1529) RPR: Non Reactive (03/07 1133)  HBsAg: Negative (11/29 1529)  HIV: Non Reactive (03/07 1133)  GBS: Negative/-- (05/09 1118)  2 hr Glucola passed Genetic screening carrier isovaleric acidemia, otherwise normal Anatomy US normal  Prenatal Transfer Tool  Maternal Diabetes: No Genetic Screening: Abnormal:  Results: Other:as noted above Maternal Ultrasounds/Referrals: Normal Fetal Ultrasounds or other Referrals:  None Maternal Substance Abuse:  No Significant Maternal Medications:  None Significant Maternal Lab Results: Group B Strep negative  Results for orders placed or performed during the hospital encounter of 12/09/20 (from the past 24  hour(s))  POCT fern test   Collection Time: 12/09/20 10:10 PM  Result Value Ref Range   POCT Fern Test Positive = ruptured amniotic membanes   Resp Panel by RT-PCR (Flu A&B, Covid) Nasopharyngeal Swab   Collection Time: 12/09/20 10:18 PM   Specimen: Nasopharyngeal Swab; Nasopharyngeal(NP) swabs in vial transport medium  Result Value Ref Range   SARS Coronavirus 2 by RT PCR NEGATIVE NEGATIVE   Influenza A by PCR NEGATIVE NEGATIVE   Influenza B by PCR NEGATIVE NEGATIVE  CBC   Collection Time: 12/09/20 10:19 PM  Result Value Ref Range   WBC 10.9 (H) 4.0 - 10.5 K/uL   RBC 4.08 3.87 -  5.11 MIL/uL   Hemoglobin 11.1 (L) 12.0 - 15.0 g/dL   HCT 34.5 (L) 36.0 - 46.0 %   MCV 84.6 80.0 - 100.0 fL   MCH 27.2 26.0 - 34.0 pg   MCHC 32.2 30.0 - 36.0 g/dL   RDW 14.8 11.5 - 15.5 %   Platelets 356 150 - 400 K/uL   nRBC 0.0 0.0 - 0.2 %  Type and screen [place]   Collection Time: 12/09/20 10:19 PM  Result Value Ref Range   ABO/RH(D) O POS    Antibody Screen NEG    Sample Expiration      12/12/2020,2359 Performed at [facility], [place] [address], [place], [place] [address]     Patient Active Problem List   Diagnosis Date Noted  . Normal labor 12/09/2020  . Sciatica of right side 11/07/2020  . Leg cramps in pregnancy 11/07/2020  . Anemia affecting pregnancy in third trimester 10/04/2020  . Asthma affecting pregnancy in first trimester 06/27/2020  . Supervision of normal first pregnancy, antepartum 05/04/2020  . Hidradenitis suppurativa 10/08/2019  . Dysmenorrhea 05/02/2015  . Asthma, mild intermittent, well-controlled 10/15/2013  . Allergic rhinitis 10/15/2013    Assessment/Plan:  Natalie [name] is a 20 y.o. G1P0 at 78w1dhere for SOL.  #Labor: Continue expectant management. #Pain: epidural #FWB: Cat 1 #ID: GBS neg #MOF: both #MOC: post placental liletta, discussed risks/benefits, consents signed. Ordered. #Circ: n/a #BMI 349#Asthma: diagnosed as child,  no history of intubation but has been hospitalized as a child. Good control as an adult with PRN albtuerol, no daily controller.  AArrie Senate MD  12/09/2020, 11:48 PM

## 2020-12-09 NOTE — MAU Note (Signed)
Pt reports ctx since about 9:30 am. Getting stronger and closer together. About 4-5 min apart. Denies any leaking or bleeding at this time. Good fetal movement felt.

## 2020-12-09 NOTE — MAU Note (Addendum)
PT SAYS SROM  AT 9PM-  VE  2 CM - IN MAU .   DENIES HSV AND MRSA.  GBS- NEG FEELS REG UC'S.

## 2020-12-09 NOTE — Anesthesia Procedure Notes (Signed)
Epidural Patient location during procedure: OB Start time: 12/09/2020 11:04 PM End time: 12/09/2020 11:12 PM  Staffing Anesthesiologist: Lannie Fields, DO Performed: anesthesiologist   Preanesthetic Checklist Completed: patient identified, IV checked, risks and benefits discussed, monitors and equipment checked, pre-op evaluation and timeout performed  Epidural Patient position: sitting Prep: DuraPrep and site prepped and draped Patient monitoring: continuous pulse ox, blood pressure, heart rate and cardiac monitor Approach: midline Location: L3-L4 Injection technique: LOR air  Needle:  Needle type: Tuohy  Needle gauge: 17 G Needle length: 9 cm Needle insertion depth: 6 cm Catheter type: closed end flexible Catheter size: 19 Gauge Catheter at skin depth: 11 cm Test dose: negative  Assessment Sensory level: T8 Events: blood not aspirated, injection not painful, no injection resistance, no paresthesia and negative IV test  Additional Notes Patient identified. Risks/Benefits/Options discussed with patient including but not limited to bleeding, infection, nerve damage, paralysis, failed block, incomplete pain control, headache, blood pressure changes, nausea, vomiting, reactions to medication both or allergic, itching and postpartum back pain. Confirmed with bedside nurse the patient's most recent platelet count. Confirmed with patient that they are not currently taking any anticoagulation, have any bleeding history or any family history of bleeding disorders. Patient expressed understanding and wished to proceed. All questions were answered. Sterile technique was used throughout the entire procedure. Please see nursing notes for vital signs. Test dose was given through epidural catheter and negative prior to continuing to dose epidural or start infusion. Warning signs of high block given to the patient including shortness of breath, tingling/numbness in hands, complete motor  block, or any concerning symptoms with instructions to call for help. Patient was given instructions on fall risk and not to get out of bed. All questions and concerns addressed with instructions to call with any issues or inadequate analgesia.  Reason for block:procedure for pain

## 2020-12-10 ENCOUNTER — Encounter (HOSPITAL_COMMUNITY): Payer: Self-pay | Admitting: Obstetrics & Gynecology

## 2020-12-10 ENCOUNTER — Encounter (HOSPITAL_COMMUNITY)
Admission: AD | Disposition: A | Discharge status: Home / Self Care | Payer: Self-pay | Source: Home / Self Care | Attending: Obstetrics & Gynecology

## 2020-12-10 DIAGNOSIS — Z975 Presence of (intrauterine) contraceptive device: Secondary | ICD-10-CM

## 2020-12-10 DIAGNOSIS — Z3A37 37 weeks gestation of pregnancy: Secondary | ICD-10-CM

## 2020-12-10 DIAGNOSIS — O321XX0 Maternal care for breech presentation, not applicable or unspecified: Secondary | ICD-10-CM

## 2020-12-10 DIAGNOSIS — Z30014 Encounter for initial prescription of intrauterine contraceptive device: Secondary | ICD-10-CM

## 2020-12-10 LAB — RPR: RPR Ser Ql: NONREACTIVE

## 2020-12-10 LAB — CBC
HCT: 33.4 % — ABNORMAL LOW (ref 36.0–46.0)
Hemoglobin: 10.7 g/dL — ABNORMAL LOW (ref 12.0–15.0)
MCH: 27.3 pg (ref 26.0–34.0)
MCHC: 32 g/dL (ref 30.0–36.0)
MCV: 85.2 fL (ref 80.0–100.0)
Platelets: 340 10*3/uL (ref 150–400)
RBC: 3.92 MIL/uL (ref 3.87–5.11)
RDW: 14.9 % (ref 11.5–15.5)
WBC: 16.5 10*3/uL — ABNORMAL HIGH (ref 4.0–10.5)
nRBC: 0 % (ref 0.0–0.2)

## 2020-12-10 SURGERY — Surgical Case
Anesthesia: Epidural | Wound class: Clean Contaminated

## 2020-12-10 MED ORDER — IBUPROFEN 600 MG PO TABS
600.0000 mg | ORAL_TABLET | Freq: Four times a day (QID) | ORAL | Status: DC
  Administered 2020-12-11 – 2020-12-13 (×8): 600 mg via ORAL
  Filled 2020-12-10 (×9): qty 1

## 2020-12-10 MED ORDER — NALBUPHINE HCL 10 MG/ML IJ SOLN: 5.0000 mg | Freq: Once | INTRAMUSCULAR | Status: DC | PRN

## 2020-12-10 MED ORDER — KETOROLAC TROMETHAMINE 30 MG/ML IJ SOLN: 30.0000 mg | Freq: Four times a day (QID) | INTRAMUSCULAR | Status: DC | PRN

## 2020-12-10 MED ORDER — NALBUPHINE HCL 10 MG/ML IJ SOLN: 5.0000 mg | INTRAMUSCULAR | Status: DC | PRN

## 2020-12-10 MED ORDER — SIMETHICONE 80 MG PO CHEW
80.0000 mg | CHEWABLE_TABLET | Freq: Three times a day (TID) | ORAL | Status: DC
  Administered 2020-12-10 – 2020-12-13 (×9): 80 mg via ORAL
  Filled 2020-12-10 (×10): qty 1

## 2020-12-10 MED ORDER — HYDROMORPHONE HCL 1 MG/ML IJ SOLN: 0.2500 mg | INTRAMUSCULAR | Status: DC | PRN

## 2020-12-10 MED ORDER — SODIUM CHLORIDE 0.9 % IV SOLN
INTRAVENOUS | Status: AC
  Filled 2020-12-10: qty 500

## 2020-12-10 MED ORDER — KETOROLAC TROMETHAMINE 30 MG/ML IJ SOLN
30.0000 mg | Freq: Four times a day (QID) | INTRAMUSCULAR | Status: AC
  Administered 2020-12-10 – 2020-12-11 (×4): 30 mg via INTRAVENOUS
  Filled 2020-12-10 (×4): qty 1

## 2020-12-10 MED ORDER — OXYCODONE HCL 5 MG/5ML PO SOLN: 5.0000 mg | Freq: Once | ORAL | Status: DC | PRN

## 2020-12-10 MED ORDER — MEPERIDINE HCL 25 MG/ML IJ SOLN
INTRAMUSCULAR | Status: DC | PRN
  Administered 2020-12-10 (×2): 12.5 mg via INTRAVENOUS

## 2020-12-10 MED ORDER — DIPHENHYDRAMINE HCL 50 MG/ML IJ SOLN: 12.5000 mg | INTRAMUSCULAR | Status: DC | PRN

## 2020-12-10 MED ORDER — SODIUM CHLORIDE 0.9 % IR SOLN
Status: DC | PRN
  Administered 2020-12-10: 1

## 2020-12-10 MED ORDER — DIPHENHYDRAMINE HCL 25 MG PO CAPS: 25.0000 mg | ORAL_CAPSULE | ORAL | Status: DC | PRN

## 2020-12-10 MED ORDER — SODIUM CHLORIDE 0.9% FLUSH: 3.0000 mL | INTRAVENOUS | Status: DC | PRN

## 2020-12-10 MED ORDER — MORPHINE SULFATE (PF) 0.5 MG/ML IJ SOLN
INTRAMUSCULAR | Status: AC
  Filled 2020-12-10: qty 10

## 2020-12-10 MED ORDER — MEPERIDINE HCL 25 MG/ML IJ SOLN
INTRAMUSCULAR | Status: AC
  Filled 2020-12-10: qty 1

## 2020-12-10 MED ORDER — PROMETHAZINE HCL 25 MG/ML IJ SOLN: 6.2500 mg | INTRAMUSCULAR | Status: DC | PRN

## 2020-12-10 MED ORDER — STERILE WATER FOR IRRIGATION IR SOLN
Status: DC | PRN
  Administered 2020-12-10: 1

## 2020-12-10 MED ORDER — ACETAMINOPHEN 500 MG PO TABS
1000.0000 mg | ORAL_TABLET | Freq: Four times a day (QID) | ORAL | Status: AC
  Administered 2020-12-10 (×3): 1000 mg via ORAL
  Filled 2020-12-10 (×2): qty 2

## 2020-12-10 MED ORDER — SCOPOLAMINE 1 MG/3DAYS TD PT72
MEDICATED_PATCH | TRANSDERMAL | Status: AC
  Filled 2020-12-10: qty 1

## 2020-12-10 MED ORDER — MEPERIDINE HCL 25 MG/ML IJ SOLN: 6.2500 mg | INTRAMUSCULAR | Status: DC | PRN

## 2020-12-10 MED ORDER — TETANUS-DIPHTH-ACELL PERTUSSIS 5-2.5-18.5 LF-MCG/0.5 IM SUSY: 0.5000 mL | PREFILLED_SYRINGE | Freq: Once | INTRAMUSCULAR | Status: DC

## 2020-12-10 MED ORDER — KETOROLAC TROMETHAMINE 30 MG/ML IJ SOLN
30.0000 mg | Freq: Once | INTRAMUSCULAR | Status: AC | PRN
  Administered 2020-12-10: 30 mg via INTRAVENOUS

## 2020-12-10 MED ORDER — ONDANSETRON HCL 4 MG/2ML IJ SOLN: 4.0000 mg | Freq: Three times a day (TID) | INTRAMUSCULAR | Status: DC | PRN

## 2020-12-10 MED ORDER — LIDOCAINE-EPINEPHRINE (PF) 2 %-1:200000 IJ SOLN
INTRAMUSCULAR | Status: DC | PRN
  Administered 2020-12-10: 5 mL via EPIDURAL
  Administered 2020-12-10: 10 mL via EPIDURAL

## 2020-12-10 MED ORDER — CEFAZOLIN SODIUM-DEXTROSE 2-4 GM/100ML-% IV SOLN
2.0000 g | Freq: Once | INTRAVENOUS | Status: AC
  Administered 2020-12-10: 2 g via INTRAVENOUS

## 2020-12-10 MED ORDER — OXYCODONE HCL 5 MG PO TABS
5.0000 mg | ORAL_TABLET | ORAL | Status: DC | PRN
  Administered 2020-12-11 (×2): 10 mg via ORAL
  Administered 2020-12-11: 5 mg via ORAL
  Administered 2020-12-11 – 2020-12-13 (×8): 10 mg via ORAL
  Filled 2020-12-10 (×9): qty 2
  Filled 2020-12-10: qty 1
  Filled 2020-12-10: qty 2

## 2020-12-10 MED ORDER — ONDANSETRON HCL 4 MG/2ML IJ SOLN
INTRAMUSCULAR | Status: AC
  Filled 2020-12-10: qty 2

## 2020-12-10 MED ORDER — FENTANYL CITRATE (PF) 100 MCG/2ML IJ SOLN
INTRAMUSCULAR | Status: DC | PRN
  Administered 2020-12-10: 100 ug via EPIDURAL

## 2020-12-10 MED ORDER — OXYTOCIN-SODIUM CHLORIDE 30-0.9 UT/500ML-% IV SOLN
1.0000 m[IU]/min | INTRAVENOUS | Status: DC
  Administered 2020-12-10: 2 m[IU]/min via INTRAVENOUS
  Filled 2020-12-10: qty 500

## 2020-12-10 MED ORDER — KETOROLAC TROMETHAMINE 30 MG/ML IJ SOLN
30.0000 mg | Freq: Once | INTRAMUSCULAR | Status: AC
  Administered 2020-12-10: 30 mg via INTRAVENOUS

## 2020-12-10 MED ORDER — PHENYLEPHRINE HCL (PRESSORS) 10 MG/ML IV SOLN
INTRAVENOUS | Status: DC | PRN
  Administered 2020-12-10: 80 ug via INTRAVENOUS

## 2020-12-10 MED ORDER — SIMETHICONE 80 MG PO CHEW
80.0000 mg | CHEWABLE_TABLET | ORAL | Status: DC | PRN
  Administered 2020-12-12: 80 mg via ORAL

## 2020-12-10 MED ORDER — LACTATED RINGERS IV SOLN: INTRAVENOUS | Status: DC | PRN

## 2020-12-10 MED ORDER — FENTANYL CITRATE (PF) 100 MCG/2ML IJ SOLN
INTRAMUSCULAR | Status: AC
  Filled 2020-12-10: qty 2

## 2020-12-10 MED ORDER — WITCH HAZEL-GLYCERIN EX PADS: 1.0000 "application " | MEDICATED_PAD | CUTANEOUS | Status: DC | PRN

## 2020-12-10 MED ORDER — DEXAMETHASONE SODIUM PHOSPHATE 4 MG/ML IJ SOLN
INTRAMUSCULAR | Status: DC | PRN
  Administered 2020-12-10: 4 mg via INTRAVENOUS

## 2020-12-10 MED ORDER — NALOXONE HCL 4 MG/10ML IJ SOLN
1.0000 ug/kg/h | INTRAVENOUS | Status: DC | PRN
  Filled 2020-12-10: qty 5

## 2020-12-10 MED ORDER — TERBUTALINE SULFATE 1 MG/ML IJ SOLN: 0.2500 mg | Freq: Once | INTRAMUSCULAR | Status: DC | PRN

## 2020-12-10 MED ORDER — SODIUM CHLORIDE 0.9 % IV SOLN: INTRAVENOUS | Status: DC | PRN

## 2020-12-10 MED ORDER — MORPHINE SULFATE (PF) 0.5 MG/ML IJ SOLN
INTRAMUSCULAR | Status: DC | PRN
  Administered 2020-12-10: 3 mg via EPIDURAL

## 2020-12-10 MED ORDER — SODIUM CHLORIDE 0.9 % IV SOLN
500.0000 mg | Freq: Once | INTRAVENOUS | Status: AC
  Administered 2020-12-10: 500 mg via INTRAVENOUS

## 2020-12-10 MED ORDER — ACETAMINOPHEN 325 MG PO TABS
650.0000 mg | ORAL_TABLET | ORAL | Status: DC | PRN
  Administered 2020-12-11 – 2020-12-13 (×9): 650 mg via ORAL
  Filled 2020-12-10 (×9): qty 2

## 2020-12-10 MED ORDER — OXYTOCIN-SODIUM CHLORIDE 30-0.9 UT/500ML-% IV SOLN
2.5000 [IU]/h | INTRAVENOUS | Status: AC
  Administered 2020-12-10: 2.5 [IU]/h via INTRAVENOUS
  Filled 2020-12-10: qty 500

## 2020-12-10 MED ORDER — KETOROLAC TROMETHAMINE 30 MG/ML IJ SOLN
INTRAMUSCULAR | Status: AC
  Filled 2020-12-10: qty 1

## 2020-12-10 MED ORDER — LACTATED RINGERS IV SOLN: INTRAVENOUS | Status: DC

## 2020-12-10 MED ORDER — NALOXONE HCL 0.4 MG/ML IJ SOLN: 0.4000 mg | INTRAMUSCULAR | Status: DC | PRN

## 2020-12-10 MED ORDER — COCONUT OIL OIL: 1.0000 "application " | TOPICAL_OIL | Status: DC | PRN

## 2020-12-10 MED ORDER — SCOPOLAMINE 1 MG/3DAYS TD PT72
MEDICATED_PATCH | TRANSDERMAL | Status: DC | PRN
  Administered 2020-12-10: 1 via TRANSDERMAL

## 2020-12-10 MED ORDER — MENTHOL 3 MG MT LOZG: 1.0000 | LOZENGE | OROMUCOSAL | Status: DC | PRN

## 2020-12-10 MED ORDER — OXYCODONE HCL 5 MG PO TABS: 5.0000 mg | ORAL_TABLET | Freq: Once | ORAL | Status: DC | PRN

## 2020-12-10 MED ORDER — OXYTOCIN-SODIUM CHLORIDE 30-0.9 UT/500ML-% IV SOLN
INTRAVENOUS | Status: AC
  Filled 2020-12-10: qty 500

## 2020-12-10 MED ORDER — DIPHENHYDRAMINE HCL 25 MG PO CAPS: 25.0000 mg | ORAL_CAPSULE | Freq: Four times a day (QID) | ORAL | Status: DC | PRN

## 2020-12-10 MED ORDER — ENOXAPARIN SODIUM 40 MG/0.4ML IJ SOSY
40.0000 mg | PREFILLED_SYRINGE | INTRAMUSCULAR | Status: DC
  Administered 2020-12-10 – 2020-12-12 (×3): 40 mg via SUBCUTANEOUS
  Filled 2020-12-10 (×3): qty 0.4

## 2020-12-10 MED ORDER — SENNOSIDES-DOCUSATE SODIUM 8.6-50 MG PO TABS
2.0000 | ORAL_TABLET | ORAL | Status: DC
  Administered 2020-12-10 – 2020-12-13 (×4): 2 via ORAL
  Filled 2020-12-10 (×4): qty 2

## 2020-12-10 MED ORDER — MEASLES, MUMPS & RUBELLA VAC IJ SOLR: 0.5000 mL | Freq: Once | INTRAMUSCULAR | Status: DC

## 2020-12-10 MED ORDER — DIBUCAINE (PERIANAL) 1 % EX OINT
1.0000 | TOPICAL_OINTMENT | CUTANEOUS | Status: DC | PRN
Start: 2020-12-10 — End: 2020-12-13

## 2020-12-10 MED ORDER — SODIUM CHLORIDE (PF) 0.9 % IJ SOLN
INTRAMUSCULAR | Status: AC
  Filled 2020-12-10: qty 10

## 2020-12-10 MED ORDER — SCOPOLAMINE 1 MG/3DAYS TD PT72: 1.0000 | MEDICATED_PATCH | Freq: Once | TRANSDERMAL | Status: DC

## 2020-12-10 MED ORDER — PHENYLEPHRINE 40 MCG/ML (10ML) SYRINGE FOR IV PUSH (FOR BLOOD PRESSURE SUPPORT)
PREFILLED_SYRINGE | INTRAVENOUS | Status: AC
  Filled 2020-12-10: qty 10

## 2020-12-10 MED ORDER — DEXAMETHASONE SODIUM PHOSPHATE 4 MG/ML IJ SOLN
INTRAMUSCULAR | Status: AC
  Filled 2020-12-10: qty 1

## 2020-12-10 MED ORDER — PRENATAL MULTIVITAMIN CH
1.0000 | ORAL_TABLET | Freq: Every day | ORAL | Status: DC
  Administered 2020-12-10 – 2020-12-12 (×3): 1 via ORAL
  Filled 2020-12-10 (×4): qty 1

## 2020-12-10 MED ORDER — ONDANSETRON HCL 4 MG/2ML IJ SOLN
INTRAMUSCULAR | Status: DC | PRN
  Administered 2020-12-10: 4 mg via INTRAVENOUS

## 2020-12-10 MED ORDER — OXYTOCIN-SODIUM CHLORIDE 30-0.9 UT/500ML-% IV SOLN
INTRAVENOUS | Status: DC | PRN
  Administered 2020-12-10: 30 [IU] via INTRAVENOUS

## 2020-12-10 MED ORDER — LEVONORGESTREL 20.1 MCG/DAY IU IUD
INTRAUTERINE_SYSTEM | INTRAUTERINE | Status: AC
  Filled 2020-12-10: qty 1

## 2020-12-10 SURGICAL SUPPLY — 35 items
BENZOIN TINCTURE PRP APPL 2/3 (GAUZE/BANDAGES/DRESSINGS) ×2 IMPLANT
CHLORAPREP W/TINT 26ML (MISCELLANEOUS) ×2 IMPLANT
CLAMP CORD UMBIL (MISCELLANEOUS) IMPLANT
CLOTH BEACON ORANGE TIMEOUT ST (SAFETY) ×2 IMPLANT
DERMABOND ADVANCED (GAUZE/BANDAGES/DRESSINGS)
DERMABOND ADVANCED .7 DNX12 (GAUZE/BANDAGES/DRESSINGS) IMPLANT
DRSG OPSITE POSTOP 4X10 (GAUZE/BANDAGES/DRESSINGS) ×2 IMPLANT
ELECT REM PT RETURN 9FT ADLT (ELECTROSURGICAL) ×2
ELECTRODE REM PT RTRN 9FT ADLT (ELECTROSURGICAL) ×1 IMPLANT
EXTRACTOR VACUUM KIWI (MISCELLANEOUS) IMPLANT
GLOVE BIOGEL PI IND STRL 7.0 (GLOVE) ×3 IMPLANT
GLOVE BIOGEL PI INDICATOR 7.0 (GLOVE) ×3
GLOVE ECLIPSE 6.5 STRL STRAW (GLOVE) ×2 IMPLANT
GOWN STRL REUS W/TWL LRG LVL3 (GOWN DISPOSABLE) ×6 IMPLANT
KIT ABG SYR 3ML LUER SLIP (SYRINGE) IMPLANT
NEEDLE HYPO 25X5/8 SAFETYGLIDE (NEEDLE) IMPLANT
NS IRRIG 1000ML POUR BTL (IV SOLUTION) ×2 IMPLANT
PACK C SECTION WH (CUSTOM PROCEDURE TRAY) ×2 IMPLANT
PAD ABD 7.5X8 STRL (GAUZE/BANDAGES/DRESSINGS) ×2 IMPLANT
PAD OB MATERNITY 4.3X12.25 (PERSONAL CARE ITEMS) ×2 IMPLANT
PENCIL SMOKE EVAC W/HOLSTER (ELECTROSURGICAL) ×2 IMPLANT
RTRCTR C-SECT PINK 25CM LRG (MISCELLANEOUS) ×2 IMPLANT
STRIP CLOSURE SKIN 1/2X4 (GAUZE/BANDAGES/DRESSINGS) ×2 IMPLANT
SUT PLAIN 0 NONE (SUTURE) IMPLANT
SUT PLAIN 2 0 XLH (SUTURE) IMPLANT
SUT VIC AB 0 CT1 27 (SUTURE) ×2
SUT VIC AB 0 CT1 27XBRD ANBCTR (SUTURE) ×2 IMPLANT
SUT VIC AB 0 CTX 36 (SUTURE) ×3
SUT VIC AB 0 CTX36XBRD ANBCTRL (SUTURE) ×3 IMPLANT
SUT VIC AB 2-0 CT1 27 (SUTURE) ×1
SUT VIC AB 2-0 CT1 TAPERPNT 27 (SUTURE) ×1 IMPLANT
SUT VIC AB 4-0 KS 27 (SUTURE) ×2 IMPLANT
TOWEL OR 17X24 6PK STRL BLUE (TOWEL DISPOSABLE) ×2 IMPLANT
TRAY FOLEY W/BAG SLVR 14FR LF (SET/KITS/TRAYS/PACK) IMPLANT
WATER STERILE IRR 1000ML POUR (IV SOLUTION) ×2 IMPLANT

## 2020-12-10 NOTE — Lactation Note (Signed)
This note was copied from a baby's chart. Lactation Consultation Note  Patient Name: Girl Ross Hefferan OINOM'V Date: 12/10/2020   Age:20 Hours  Mother is a P1, infant is 37.2 weeks, wt 6-4 . Mother was given [facility] brochure and LPI Parent instructions handout with review. Basic teaching done.    Reviewed hand expression with mother. Observed very tiny drops of  Colostrum.   Mother reports that she is not active with WIC.    infant latched on at the left breast in football hold. Several attempts were made to latch infant. Observed infant suckling with audible swallows. Infant sustained latch for 20 mins.  Staff nurse to sat up DEBP for Mother .   Plan of Care : Breastfeed infant with feeding cues Supplement infant with ebm according to supplemental guidelines. Pump using a DEBP after each feeding for 15-20 mins.   Mother to continue to cue base feed infant and feed at least 8-12 times or more in 24 hours and advised to allow for cluster feeding infant as needed.  Mother to continue to due STS. Mother is aware of available LC services at [facility], BFSG'S, OP Dept, and phone # for questions or concerns about breastfeeding.  Mother receptive to all teaching and plan of care.   Maternal Data    Feeding    LATCH Score                    Lactation Tools Discussed/Used    Interventions    Discharge    Consult Status      Michel Bickers 12/10/2020, 2:21 PM

## 2020-12-10 NOTE — Transfer of Care (Signed)
Immediate Anesthesia Transfer of Care Note  Patient: Natalie [name]  Procedure(s) Performed: CESAREAN SECTION (N/A )  Patient Location: PACU  Anesthesia Type:Epidural  Level of Consciousness: awake, alert  and oriented  Airway & Oxygen Therapy: Patient Spontanous Breathing  Post-op Assessment: Report given to RN and Post -op Vital signs reviewed and stable  Post vital signs: Reviewed and stable  Last Vitals:  Vitals Value Taken Time  BP 97/79 12/10/20 0436  Temp    Pulse 81 12/10/20 0440  Resp 13 12/10/20 0440  SpO2 100 % 12/10/20 0440  Vitals shown include unvalidated device data.  Last Pain:  Vitals:   12/09/20 2319  TempSrc:   PainSc: 6          Complications: No complications documented.

## 2020-12-10 NOTE — Progress Notes (Signed)
Called to bedside by RN, Natalie [name] with concern for presenting part vs cord prolapse. Upon my cervical exam patient noted to be 9/90/0 with breech presentation, buttocks felt on cervical exam. Confirmed with BSUS. Discussed with MOB that since she is a G1 she is not a candidate for breech presentation. FHT reassuring, cat since prolonged decel. Dr. Charlotta Newton notified and reported to bedside.  The risks of cesarean section were discussed with the patient including but were not limited to: bleeding which may require transfusion or reoperation; infection which may require antibiotics; injury to bowel, bladder, ureters or other surrounding organs; injury to the fetus; need for additional procedures including hysterectomy in the event of a life-threatening hemorrhage; placental abnormalities wth subsequent pregnancies, incisional problems, thromboembolic phenomenon and other postoperative/anesthesia complications. Anesthesia and OR aware.  Preoperative prophylactic antibiotics and SCDs ordered on call to the OR.  To OR when ready.  Natalie Seton, MD OB Fellow, Faculty [facility], Center for [facility] 12/10/2020 3:28 AM

## 2020-12-10 NOTE — Op Note (Addendum)
Natalie [name] PROCEDURE DATE: 12/10/2020  PREOPERATIVE DIAGNOSES: Intrauterine pregnancy at [redacted]w[redacted]d weeks gestation; malpresentation: frank breech  and desire for long acting reversible contraception  POSTOPERATIVE DIAGNOSES: The same  PROCEDURE: Primary Low Transverse Cesarean Section IUD insertion  SURGEON:  Dr. Myna Hidalgo  ASSISTANT:  Dr. Mart Piggs  ANESTHESIOLOGY TEAM: Anesthesiologist: Lannie Fields, DO CRNA: Armanda Heritage, CRNA  INDICATIONS: Natalie [name] is a 20 y.o. G1P0 at [redacted]w[redacted]d here for cesarean section secondary to the indications listed under preoperative diagnoses; please see preoperative note for further details.  The risks of cesarean section were discussed with the patient including but were not limited to: bleeding which may require transfusion or reoperation; infection which may require antibiotics; injury to bowel, bladder, ureters or other surrounding organs; injury to the fetus; need for additional procedures including hysterectomy in the event of a life-threatening hemorrhage; placental abnormalities wth subsequent pregnancies, incisional problems, thromboembolic phenomenon and other postoperative/anesthesia complications.   The patient concurred with the proposed plan, giving informed written consent for the procedure.    FINDINGS:  Viable female infant in cephalic presentation.  Apgars 7 and 9.  Clear amniotic fluid.  Intact placenta, three vessel cord.  Normal uterus, fallopian tubes and ovaries bilaterally.  ANESTHESIA: Epidural  INTRAVENOUS FLUIDS: 2400 ml   ESTIMATED BLOOD LOSS: 216 ml URINE OUTPUT:  150 ml SPECIMENS: Placenta sent to L&D COMPLICATIONS: None immediate  PROCEDURE IN DETAIL:  The patient preoperatively received intravenous antibiotics and had sequential compression devices applied to her lower extremities.  She was then taken to the operating room where the epidural anesthesia was dosed up to surgical level and was  found to be adequate. She was then placed in a dorsal supine position with a leftward tilt, and prepped and draped in a sterile manner.  A foley catheter was in place in her bladder and attached to constant gravity.  After an adequate timeout was performed, a Pfannenstiel skin incision was made with scalpel and carried through to the underlying layer of fascia. The fascia was incised in the midline, and this incision was extended bilaterally using the Mayo scissors.  Kocher clamps were applied to the superior aspect of the fascial incision and the underlying rectus muscles were dissected off bluntly and sharply.  A similar process was carried out on the inferior aspect of the fascial incision. The rectus muscles were separated in the midline and the peritoneum was entered bluntly. The Alexis self-retaining retractor was introduced into the abdominal cavity.  Attention was turned to the lower uterine segment where a low transverse hysterotomy was made with a scalpel and extended bilaterally bluntly.  The infant was successfully delivered, the cord was clamped and cut immediately, and the infant was handed over to the awaiting neonatology team. Arterial blood gas was sent. Uterine massage was then administered, and the placenta delivered intact with a three-vessel cord. The uterus was then cleared of clots and debris.  A liletta intrauterine device was grasped and placed at the uterine fundus after strings had been trimmed. Strings fed through the cervix with kelly clamp. The hysterotomy was closed with 0 Vicryl in a running locked fashion, and an imbricating layer was also placed with 0 Vicryl.  The pelvis was cleared of all clot and debris. Hemostasis was confirmed on all surfaces.  The retractor was removed.  The peritoneum was closed with a 0 Vicryl running stitch. The fascia was then closed using 0 Vicryl in a running fashion.  The subcutaneous layer was irrigated,  reapproximated with 2-0 plain gut interrupted  stitches, and the skin was closed with a 4-0 Vicryl subcuticular stitch. The patient tolerated the procedure well. Sponge, instrument and needle counts were correct x 3.  She was taken to the recovery room in stable condition.   Alric Seton, MD OB Fellow, Faculty Practice [facility], Center for [facility] 12/10/2020 4:54 AM  Attestation of Attending Supervision of Obstetric Fellow: Evaluation and management procedures were performed by the Obstetric Fellow under my supervision and collaboration.  I have reviewed the Obstetric Fellow's note and chart, and I agree with the management and plan. I have also made any necessary editorial changes.   Sharon Seller, DO Attending Obstetrician & Gynecologist, [facility] for [facility], [facility] 12/12/2020 1:39 PM

## 2020-12-10 NOTE — Anesthesia Postprocedure Evaluation (Signed)
Anesthesia Post Note  Patient: Sona Nations  Procedure(s) Performed: CESAREAN SECTION (N/A )     Patient location during evaluation: PACU Anesthesia Type: Epidural Level of consciousness: oriented and awake and alert Pain management: pain level controlled Vital Signs Assessment: post-procedure vital signs reviewed and stable Respiratory status: spontaneous breathing and respiratory function stable Cardiovascular status: blood pressure returned to baseline and stable Postop Assessment: no headache, no backache, no apparent nausea or vomiting, epidural receding and patient able to bend at knees Anesthetic complications: no   No complications documented.  Last Vitals:  Vitals:   12/10/20 0600 12/10/20 0615  BP: 99/88   Pulse: 64   Resp: 17   Temp:  37 C  SpO2: 99%     Last Pain:  Vitals:   12/10/20 0615  TempSrc:   PainSc: 4    Pain Goal:                Epidural/Spinal Function Cutaneous sensation: Able to Wiggle Toes (12/10/20 0615), Patient able to flex knees: Yes (12/10/20 0615), Patient able to lift hips off bed: Yes (12/10/20 0615), Back pain beyond tenderness at insertion site: No (12/10/20 0615), Progressively worsening motor and/or sensory loss: No (12/10/20 0615), Bowel and/or bladder incontinence post epidural: No (12/10/20 0615)  Lannie Fields

## 2020-12-10 NOTE — Procedures (Signed)
  Post-Placental IUD Insertion Procedure Note: Intra-op  Patient identified, informed consent signed prior to delivery, signed copy in chart, time out was performed.    Liletta  - IUD grasped between sterile gloved fingers.  IUD inserted to fundus with bimanual technique after strings trimmed. IUD strings fed through cervix with kelly clamps.  Patient given post procedure instructions and IUD care card with expiration date.  Patient is asked to keep IUD strings tucked in her vagina until her postpartum follow up visit in 4-6 weeks. Patient advised to abstain from sexual intercourse and pulling on strings before her follow-up visit. Patient verbalized an understanding of the plan of care and agrees.  Alric Seton, MD OB Fellow, Faculty [facility], Center for [facility] 12/10/2020 4:56 AM

## 2020-12-10 NOTE — Progress Notes (Signed)
At bedside to discuss management- fetal presentation noted to be breech. Risk benefits and alternatives of cesarean section were discussed with the patient including but not limited to infection, bleeding, damage to bowel , bladder and baby with the need for further surgery. Pt voiced understanding and desires to proceed.   Myna Hidalgo, DO Attending Obstetrician & Gynecologist, [facility] for [employer], [facility]

## 2020-12-10 NOTE — Discharge Summary (Signed)
Postpartum Discharge Summary    Patient Name: Natalie [name] DOB: 2000-11-11 MRN: [id number]  Date of admission: 12/09/2020 Delivery date:12/10/2020  Delivering provider: Janyth Pupa  Date of discharge: 12/13/2020  Admitting diagnosis: Normal labor [O80, Z37.9] Intrauterine pregnancy: [redacted]w[redacted]d    Secondary diagnosis:  Active Problems:   Asthma, mild intermittent, well-controlled   Supervision of normal first pregnancy, antepartum   Anemia affecting pregnancy in third trimester   Normal labor   Cesarean delivery delivered   IUD (intrauterine device) in place  Additional problems: none    Discharge diagnosis: Term Pregnancy Delivered                                              Post partum procedures:post placental liletta Augmentation: Pitocin Complications: None  Hospital course: Onset of Labor With Unplanned C/S   20y.o. yo G1P0 at 358w2das admitted in Latent Labor on 12/09/2020. Patient was initially checked and noted to be 5cm in the MAU with SROM _0  so patient was admitted to L&D. Upon RN recheck patient was stated to have made no cervical change so patient was started on pitocin. While on L&D there was concern by RN for malpresentation so provider checked patient and was found to be 8.5cm and frank breech, decision was made for cesarean section at that time. The patient went for cesarean section due to Malpresentation. Delivery details as follows: Membrane Rupture Time/Date: 9:00 PM ,12/09/2020   Delivery Method:C-Section, Low Transverse  Details of operation can be found in separate operative note. Patient had an uncomplicated postpartum course.  She is ambulating,tolerating a regular diet, passing flatus, and urinating well.  Patient is discharged home in stable condition 12/13/20.  Newborn Data: Birth date:12/10/2020  Birth time:3:54 AM  Gender:Female  Living status:Living  Apgars:7 ,9  Weight:2860 g   Magnesium Sulfate received: No BMZ received:  No Rhophylac:N/A MMR:N/A T-DaP:offered prior to discharge Flu: offered prior to discharge Transfusion:No  Physical exam  Vitals:   12/12/20 0417 12/12/20 1435 12/12/20 2102 12/13/20 0514  BP: 114/80 111/78 125/77 109/68  Pulse: 67 72 75 (!) 54  Resp: _1 Temp: 97.8 F (36.6 C) 98.4 F (36.9 C) 98.1 F (36.7 C) 97.7 F (36.5 C)  TempSrc: Axillary Oral Oral Oral  SpO2: 100%  100% 99%  Weight:      Height:       General: alert, cooperative and no distress Lochia: appropriate Uterine Fundus: firm Incision: Healing well with no significant drainage, No significant erythema, Honeycomb dressing is clean, dry, and intact DVT Evaluation: No evidence of DVT seen on physical exam. No cords or calf tenderness. No significant calf/ankle edema. Labs: Lab Results  Component Value Date   WBC 16.5 (H) 12/10/2020   HGB 10.7 (L) 12/10/2020   HCT 33.4 (L) 12/10/2020   MCV 85.2 12/10/2020   PLT 340 12/10/2020   CMP Latest Ref Rng & Units 04/29/2020  Glucose 70 - 99 mg/dL 88  BUN 6 - 20 mg/dL 13  Creatinine 0.44 - 1.00 mg/dL 0.54  Sodium 135 - 145 mmol/L 135  Potassium 3.5 - 5.1 mmol/L 3.5  Chloride 98 - 111 mmol/L 103  CO2 22 - 32 mmol/L 22  Calcium 8.9 - 10.3 mg/dL 8.9  Total Protein 6.5 - 8.1 g/dL 8.0  Total Bilirubin 0.3 - 1.2 mg/dL 0.3  Alkaline Phos  38 - 126 U/L 93  AST 15 - 41 U/L 18  ALT 0 - 44 U/L 18   Edinburgh Score: Edinburgh Postnatal Depression Scale Screening Tool 12/11/2020  I have been able to laugh and see the funny side of things. 0  I have looked forward with enjoyment to things. 0  I have blamed myself unnecessarily when things went wrong. 2  I have been anxious or worried for no good reason. 0  I have felt scared or panicky for no good reason. 1  Things have been getting on top of me. 1  I have been so unhappy that I have had difficulty sleeping. 0  I have felt sad or miserable. 1  I have been so unhappy that I have been crying. 0  The thought  of harming myself has occurred to me. 0  Edinburgh Postnatal Depression Scale Total 5     After visit meds:  Allergies as of 12/13/2020      Reactions   Pollen Extract Other (See Comments)   Seasonal allergies       Medication List    STOP taking these medications   aspirin EC 81 MG tablet   Blood Pressure Kit Devi   Doxylamine-Pyridoxine 10-10 MG Tbec Commonly known as: Diclegis   promethazine 25 MG tablet Commonly known as: PHENERGAN     TAKE these medications   acetaminophen 325 MG tablet Commonly known as: TYLENOL Take 2 tablets (650 mg total) by mouth every 6 (six) hours.   albuterol 108 (90 Base) MCG/ACT inhaler Commonly known as: VENTOLIN HFA 2 puffs every 4-6 hours when wheezing   cetirizine 10 MG tablet Commonly known as: ZYRTEC Take one tablet by mouth once daily for allergies   coconut oil Oil Apply 1 application topically as needed (nipple pain).   ferrous sulfate 325 (65 FE) MG tablet Commonly known as: FerrouSul Take 1 tablet (325 mg total) by mouth every other day.   ibuprofen 600 MG tablet Commonly known as: ADVIL Take 1 tablet (600 mg total) by mouth every 6 (six) hours.   oxyCODONE 5 MG immediate release tablet Commonly known as: Oxy IR/ROXICODONE Take 1 tablet (5 mg total) by mouth every 6 (six) hours as needed for moderate pain, severe pain or breakthrough pain.   prenatal multivitamin Tabs tablet Take 1 tablet by mouth daily at 12 noon.        Discharge home in stable condition Infant Feeding: Bottle and Breast Infant Disposition:home with mother Discharge instruction: per After Visit Summary and Postpartum booklet. Activity: Advance as tolerated. Pelvic rest for 6 weeks.  Diet: routine diet Future Appointments: Future Appointments  Date Time Provider [place]  12/19/2020 10:40 AM [place] None  01/23/2021 10:15 AM Ephraim Hamburger, Rona Ravens, NP [facility] None   Follow up Visit: Message sent to [facility] 12/10/20 by  Sylvester Harder.  Please schedule this patient for a In person postpartum visit in 6 weeks with the following provider: Any provider. Additional Postpartum F/U:Incision check 1 week, IUD string check at postpartum appt, 2 week mood check (per pt request) Low risk pregnancy complicated by: n/a Delivery mode:  C-Section, Low Transverse  Anticipated Birth Control:  PP IUD placed  Skylie Hiott, Gildardo Cranker, MD OB Fellow, Faculty [facility] 12/13/2020 12:08 PM

## 2020-12-10 NOTE — Progress Notes (Signed)
Discontinued the foley catheter at 2125.  Foley catheter insertion was not charted in the flow sheet.

## 2020-12-11 NOTE — Progress Notes (Addendum)
Subjective: Postpartum Day 1: Cesarean Delivery Patient reports incisional pain, tolerating PO, + flatus and no problems voiding.    Objective: Vital signs in last 24 hours: Temp:  [98.2 F (36.8 C)-99.4 F (37.4 C)] 98.2 F (36.8 C) (05/15 0517) Pulse Rate:  [65-72] 65 (05/15 0517) Resp:  [18] 18 (05/15 0517) BP: (93-104)/(50-56) 97/56 (05/15 0517) SpO2:  [99 %] 99 % (05/14 2051)  Physical Exam:  General: alert, cooperative and no distress Lochia: appropriate Uterine Fundus: firm Incision: dressing intact without drainage DVT Evaluation: No evidence of DVT seen on physical exam. Negative Homan's sign. No cords or calf tenderness. No significant calf/ankle edema.  Recent Labs    12/09/20 2219 12/10/20 0713  HGB 11.1* 10.7*  HCT 34.5* 33.4*    Assessment/Plan: Status post Cesarean section. Doing well postoperatively.  Assisted pt to latch infant in football hold for breastfeeding. Questions about breastfeeding answered, pt considering breastmilk and formula feeding.  Continue current care.  Sharen Counter 12/11/2020, 2:47 PM

## 2020-12-11 NOTE — Progress Notes (Signed)
CSW met with MOB to complete consult for resources. CSW observed MOB resting in bed, infant in bassinet, FOB on couch and MGF at bedside. MOB gave CSW verbal consent to complete consult while FOB and MGF was present. CSW explained role and reason for consult. MOB was pleasant, polite and engaged with CSW. MOB asked CSW, if  [facility] or Medicaid would pay for a breast pump. CSW inform MOB that normally [facility] would cover a breast pump and to contact them. CSW contact lactation on MOB's behalf to obtain breast pump. Lactation inform CSW that MOB was on today's list to be seen. CSW inform MOB that lactation would stop by today.   CSW provided food insecurities, transportation and housing support resources. MOB denied any additional barriers.     CSW identifies no further need for intervention or barriers to discharge at this time.  Darcus Austin, MSW, LCSW-A Clinical Social Worker [phone]

## 2020-12-11 NOTE — Lactation Note (Signed)
This note was copied from a baby's chart. Lactation Consultation Note  Patient Name: Natalie [name] [id number]'Z Date: 12/11/2020 Reason for consult: Follow-up assessment;Primapara;1st time breastfeeding;Early term 37-38.6wks Age:20 hours Mom sitting on bed holding sleeping baby. Reports plans to formula feed for duration of hospital stay, declined latching to the breast d/t pain, reports pain when pumping. Mom reports concerns with being discharged, requests to stay an extra night to learn how to care for baby, reports with support from own father and FOB.  Offered extra slow flow nipple for baby, milk noted spilling out of baby's mouth with slow flow. Reinforced discard formula after 1hr of opening. Discussed pump settings, ok to reduce suction for comfort. Mom declined pumping q3-4hrs, but will consider pumping ~4times a day in addition to formula feeding. Mom requests a pump from [facility], reports certifying a few months ago, referral sent. Reviewed diapering, skin to skin, bottle feeding, and burping. Mom declined support groups but has a friend who recently had a baby that she can reach out to for support. Mom aware of PRN status and to call for [facility] support if needed. Left the room with mom sitting in bed holding sleeping baby.  LC called K.Kane, RN and notified of mom's request to stay additional night and education on how to care for baby. BGilliam, RN, IBCLC    Feeding Mother's Current Feeding Choice: Breast Milk and Formula   Interventions Interventions: Skin to skin;DEBP  Discharge WIC Program: Yes  Consult Status Consult Status: PRN    [name] 12/11/2020, 5:26 PM

## 2020-12-12 NOTE — Lactation Note (Signed)
This note was copied from a baby's chart. Lactation Consultation Note Per MD note, mom doesn't want to BF any longer.  Patient Name: Natalie [name] GYKZL'D Date: 12/12/2020   Age:20 hours  Maternal Data    Feeding Nipple Type: Extra Slow Flow  LATCH Score                    Lactation Tools Discussed/Used    Interventions    Discharge    Consult Status Consult Status: Complete Date: 12/12/20    Charyl Dancer 12/12/2020, 9:11 PM

## 2020-12-12 NOTE — Progress Notes (Signed)
Subjective: POD#2 pLTCS-breech Patient is doing well without complaints. Ambulating without difficulty. Voiding and passing flatus. Tolerating PO. Abdominal pain improved. Vaginal bleeding decreased.   Objective: Vital signs in last 24 hours: Temp:  [97.8 F (36.6 C)-98.3 F (36.8 C)] 97.8 F (36.6 C) (05/16 0417) Pulse Rate:  [62-84] 67 (05/16 0417) Resp:  [16-18] 18 (05/16 0417) BP: (112-115)/(73-80) 114/80 (05/16 0417) SpO2:  [99 %-100 %] 100 % (05/16 0417)  Physical Exam:  General: alert, cooperative and no distress Lochia: appropriate Uterine Fundus: firm Incision: honeycomb dressing c/d/i DVT Evaluation: No evidence of DVT seen on physical exam.  Recent Labs    12/09/20 2219 12/10/20 0713  HGB 11.1* 10.7*  HCT 34.5* 33.4*    Assessment/Plan: POD#2 pLTCS-breech  -doing well, meeting pp milestones  -VSS  -breast and bottle feeding  -post placental liletta placed  Plan for discharge tomorrow.  Alric Seton 12/12/2020, 5:54 AM

## 2020-12-13 ENCOUNTER — Encounter: Payer: Medicaid Other | Admitting: Obstetrics and Gynecology

## 2020-12-13 MED ORDER — OXYCODONE HCL 5 MG PO TABS: 5.0000 mg | ORAL_TABLET | Freq: Four times a day (QID) | ORAL | 0 refills | Status: DC | PRN

## 2020-12-13 MED ORDER — ACETAMINOPHEN 325 MG PO TABS
650.0000 mg | ORAL_TABLET | Freq: Four times a day (QID) | ORAL | Status: DC
Start: 2020-12-13 — End: 2021-01-31

## 2020-12-13 MED ORDER — COCONUT OIL OIL: 1.0000 "application " | TOPICAL_OIL | 0 refills | Status: DC | PRN

## 2020-12-13 MED ORDER — FERROUS SULFATE 325 (65 FE) MG PO TABS: 325.0000 mg | ORAL_TABLET | ORAL | 2 refills | Status: DC

## 2020-12-13 MED ORDER — IBUPROFEN 600 MG PO TABS: 600.0000 mg | ORAL_TABLET | Freq: Four times a day (QID) | ORAL | 0 refills | Status: DC

## 2020-12-13 NOTE — Discharge Instructions (Signed)
Postpartum Care After Cesarean Delivery This sheet gives you information about how to care for yourself from the time you deliver your baby to up to 6-12 weeks after delivery (postpartum period). Your health care provider may also give you more specific instructions. If you have problems or questions, contact your health care provider. Follow these instructions at home: Medicines  Take over-the-counter and prescription medicines only as told by your health care provider.  If you were prescribed an antibiotic medicine, take it as told by your health care provider. Do not stop taking the antibiotic even if you start to feel better.  Ask your health care provider if the medicine prescribed to you: ? Requires you to avoid driving or using heavy machinery. ? Can cause constipation. You may need to take actions to prevent or treat constipation, such as:  Drink enough fluid to keep your urine pale yellow.  Take over-the-counter or prescription medicines.  Eat foods that are high in fiber, such as beans, whole grains, and fresh fruits and vegetables.  Limit foods that are high in fat and processed sugars, such as fried or sweet foods. Activity  Gradually return to your normal activities as told by your health care provider.  Avoid activities that take a lot of effort and energy (are strenuous) until approved by your health care provider. Walking at a slow to moderate pace is usually safe. Ask your health care provider what activities are safe for you. ? Do not lift anything that is heavier than your baby or 10 lb (4.5 kg) as told by your health care provider. ? Do not vacuum, climb stairs, or drive a car for as long as told by your health care provider.  If possible, have someone help you at home until you are able to do your usual activities yourself.  Rest as much as possible. Try to rest or take naps while your baby is sleeping. Vaginal bleeding  It is normal to have vaginal bleeding  (lochia) after delivery. Wear a sanitary pad to absorb vaginal bleeding and discharge. ? During the first week after delivery, the amount and appearance of lochia is often similar to a menstrual period. ? Over the next few weeks, it will gradually decrease to a dry, yellow-brown discharge. ? For most women, lochia stops completely by 4-6 weeks after delivery. Vaginal bleeding can vary from woman to woman.  Change your sanitary pads frequently. Watch for any changes in your flow, such as: ? A sudden increase in volume. ? A change in color. ? Large blood clots.  If you pass a blood clot, save it and call your health care provider to discuss. Do not flush blood clots down the toilet before you get instructions from your health care provider.  Do not use tampons or douches until your health care provider says this is safe.  If you are not breastfeeding, your period should return 6-8 weeks after delivery. If you are breastfeeding, your period may return anytime between 8 weeks after delivery and the time that you stop breastfeeding. Perineal care  If your C-section (Cesarean section) was unplanned, and you were allowed to labor and push before delivery, you may have pain, swelling, and discomfort of the tissue between your vaginal opening and your anus (perineum). You may also have an incision in the tissue (episiotomy) or the tissue may have torn during delivery. Follow these instructions as told by your health care provider: ? Keep your perineum clean and dry as told by your   health care provider. Use medicated pads and pain-relieving sprays and creams as directed. ? If you have an episiotomy or vaginal tear, check the area every day for signs of infection. Check for:  Redness, swelling, or pain.  Fluid or blood.  Warmth.  Pus or a bad smell. ? You may be given a squirt bottle to use instead of wiping to clean the perineum area after you go to the bathroom. As you start healing, you may use  the squirt bottle before wiping yourself. Make sure to wipe gently. ? To relieve pain caused by an episiotomy, vaginal tear, or hemorrhoids, try taking a warm sitz bath 2-3 times a day. A sitz bath is a warm water bath that is taken while you are sitting down. The water should only come up to your hips and should cover your buttocks.   Breast care  Within the first few days after delivery, your breasts may feel heavy, full, and uncomfortable (breast engorgement). You may also have milk leaking from your breasts. Your health care provider can suggest ways to help relieve breast discomfort. Breast engorgement should go away within a few days.  If you are breastfeeding: ? Wear a bra that supports your breasts and fits you well. ? Keep your nipples clean and dry. Apply creams and ointments as told by your health care provider. ? You may need to use breast pads to absorb milk leakage. ? You may have uterine contractions every time you breastfeed for several weeks after delivery. Uterine contractions help your uterus return to its normal size. ? If you have any problems with breastfeeding, work with your health care provider or a lactation consultant.  If you are not breastfeeding: ? Avoid touching your breasts as this can make your breasts produce more milk. ? Wear a well-fitting bra and use cold packs to help with swelling. ? Do not squeeze out (express) milk. This causes you to make more milk. Intimacy and sexuality  Ask your health care provider when you can engage in sexual activity. This may depend on your: ? Risk of infection. ? Healing rate. ? Comfort and desire to engage in sexual activity.  You are able to get pregnant after delivery, even if you have not had your period. If desired, talk with your health care provider about methods of family planning or birth control (contraception). Lifestyle  Do not use any products that contain nicotine or tobacco, such as cigarettes, e-cigarettes,  and chewing tobacco. If you need help quitting, ask your health care provider.  Do not drink alcohol, especially if you are breastfeeding. Eating and drinking  Drink enough fluid to keep your urine pale yellow.  Eat high-fiber foods every day. These may help prevent or relieve constipation. High-fiber foods include: ? Whole grain cereals and breads. ? Brown rice. ? Beans. ? Fresh fruits and vegetables.  Take your prenatal vitamins until your postpartum checkup or until your health care provider tells you it is okay to stop.   General instructions  Keep all follow-up visits for you and your baby as told by your health care provider. Most women visit their health care provider for a postpartum checkup within the first 3-6 weeks after delivery. Contact a health care provider if you:  Feel unable to cope with the changes that a new baby brings to your life, and these feelings do not go away.  Feel unusually sad or worried.  Have breasts that are painful, hard, or turn red.  Have a   fever.  Have trouble holding urine or keeping urine from leaking.  Have little or no interest in activities you used to enjoy.  Have not breastfed at all and you have not had a menstrual period for 12 weeks after delivery.  Have stopped breastfeeding and you have not had a menstrual period for 12 weeks after you stopped breastfeeding.  Have questions about caring for yourself or your baby.  Pass a blood clot from your vagina. Get help right away if you:  Have chest pain.  Have difficulty breathing.  Have sudden, severe leg pain.  Have severe pain or cramping in your abdomen.  Bleed from your vagina so much that you fill more than one sanitary pad in one hour. Bleeding should not be heavier than your heaviest period.  Develop a severe headache.  Faint.  Have blurred vision or spots in your vision.  Have a bad-smelling vaginal discharge.  Have thoughts about hurting yourself or your  baby. If you ever feel like you may hurt yourself or others, or have thoughts about taking your own life, get help right away. You can go to your nearest emergency department or call:  Your local emergency services (911 in the [place]  A suicide crisis helpline, such as the National Suicide Prevention Lifeline at 1-800-273-8255. This is open 24 hours a day. Summary  The period of time from when you deliver your baby to up to 6-12 weeks after delivery is called the postpartum period.  Gradually return to your normal activities as told by your health care provider.  Keep all follow-up visits for you and your baby as told by your health care provider. This information is not intended to replace advice given to you by your health care provider. Make sure you discuss any questions you have with your health care provider. Document Revised: 03/05/2018 Document Reviewed: 03/05/2018 Elsevier Patient Education  2021 Elsevier Inc.   Intrauterine Device Insertion, Care After This sheet gives you information about how to care for yourself after your procedure. Your health care provider may also give you more specific instructions. If you have problems or questions, contact your health care provider. What can I expect after the procedure? After the procedure, it is common to have:  Cramps and pain in the abdomen.  Bleeding. It may be light or heavy. This may last for a few days.  Lower back pain.  Dizziness.  Headaches.  Nausea. Follow these instructions at home:  Before resuming sexual activity, check to make sure that you can feel the IUD string or strings. You should be able to feel the end of the string below the opening of your cervix. If your IUD string is in place, you may resume sexual activity. ? If you had a hormonal IUD inserted more than 7 days after your most recent period started, you will need to use a backup method of birth control for 7 days after IUD insertion. Ask your  health care provider whether this applies to you.  Continue to check that the IUD is still in place by feeling for the strings after every menstrual period, or once a month.  An IUD will not protect you from sexually transmitted infections (STIs). Use methods to prevent the exchange of body fluids between partners (barrier protection) every time you have sex. Barrier protection can be used during oral, vaginal, or anal sex. Commonly used barrier methods include: ? Female condom. ? Female condom. ? Dental dam.  Take over-the-counter and prescription medicines   only as told by your health care provider.  Keep all follow-up visits as told by your health care provider. This is important.   Contact a health care provider if:  You feel light-headed or weak.  You have any of the following problems with your IUD string or strings: ? The string bothers or hurts you or your sexual partner. ? You cannot feel the string. ? The string has gotten longer.  You can feel the IUD in your vagina.  You think you may be pregnant, or you miss your menstrual period.  You think you may have a sexually transmitted infection (STI). Get help right away if:  You have flu-like symptoms, such as tiredness (fatigue) and muscle aches.  You have a fever and chills.  You have bleeding that is heavier or lasts longer than a normal menstrual cycle.  You have abnormal or bad-smelling discharge from your vagina.  You develop abdominal pain that is new, is getting worse, or is not in the same area of earlier cramping and pain.  You have pain during sexual activity. Summary  After the procedure, it is common to have cramps and pain in the abdomen. It is also common to have light bleeding or heavier bleeding that is like your menstrual period.  Continue to check that the IUD is still in place by feeling for the strings after every menstrual period, or once a month.  Keep all follow-up visits as told by your health  care provider. This is important.  Contact your health care provider if you have problems with your IUD strings, such as the string getting longer or bothering you or your sexual partner. This information is not intended to replace advice given to you by your health care provider. Make sure you discuss any questions you have with your health care provider. Document Revised: 07/07/2019 Document Reviewed: 07/07/2019 Elsevier Patient Education  2021 Elsevier Inc.  

## 2020-12-14 ENCOUNTER — Telehealth: Payer: Self-pay | Admitting: *Deleted

## 2020-12-14 NOTE — Telephone Encounter (Signed)
Transition Care Management Follow-up Telephone Call  Date of discharge and from where: 12/13/2020 -  [facility]  How have you been since you were released from the hospital? "A little sore but I am doing okay"  Any questions or concerns? No  Items Reviewed:  Did the pt receive and understand the discharge instructions provided? Yes   Medications obtained and verified? Yes   Other? No   Any new allergies since your discharge? No   Dietary orders reviewed? No  Do you have support at home? Yes    Functional Questionnaire: (I = Independent and D = Dependent) ADLs: I  Bathing/Dressing- I  Meal Prep- I  Eating- I  Maintaining continence- I  Transferring/Ambulation- I  Managing Meds- I  Follow up appointments reviewed:   PCP Hospital f/u appt confirmed? No    Specialist Hospital f/u appt confirmed? Yes  Scheduled to see OBGYN on 12/19/2020 @ 1040, 01/09/2021 @ 1000 and 01/23/2021 @ 1015.  Are transportation arrangements needed? No   If their condition worsens, is the pt aware to call PCP or go to the Emergency Dept.? Yes  Was the patient provided with contact information for the PCP's office or ED? Yes  Was to pt encouraged to call back with questions or concerns? Yes

## 2020-12-19 ENCOUNTER — Other Ambulatory Visit: Payer: Self-pay

## 2020-12-19 ENCOUNTER — Ambulatory Visit (INDEPENDENT_AMBULATORY_CARE_PROVIDER_SITE_OTHER): Payer: Medicaid Other

## 2020-12-19 VITALS — BP 124/88 | HR 90 | Temp 98.3°F

## 2020-12-19 DIAGNOSIS — Z4889 Encounter for other specified surgical aftercare: Secondary | ICD-10-CM

## 2020-12-19 NOTE — Progress Notes (Signed)
Patient was assessed and managed by nursing staff during this encounter. I have reviewed the chart and agree with the documentation and plan. I have also made any necessary editorial changes.  Egon Dittus, MD 12/19/2020 2:55 PM    

## 2020-12-19 NOTE — Progress Notes (Signed)
Patient presents for incision check. Wound appears clean, dry, and intact. No swelling, redness, drainage, or odor noticed.  The patient is alerted to watch for any signs of infection (redness, pus, pain, increased swelling or fever) and call if such occurs. Home wound care instructions are provided. Tetanus vaccination status reviewed: last tetanus booster within 10 years.  Patient instructed to follow up at postpartum or sooner if needed.

## 2020-12-29 ENCOUNTER — Inpatient Hospital Stay (HOSPITAL_COMMUNITY): Admit: 2020-12-29 | Payer: Self-pay

## 2021-01-09 ENCOUNTER — Encounter: Payer: Medicaid Other | Admitting: Licensed Clinical Social Worker

## 2021-01-23 ENCOUNTER — Ambulatory Visit: Payer: Medicaid Other | Admitting: Nurse Practitioner

## 2021-01-29 ENCOUNTER — Emergency Department (HOSPITAL_COMMUNITY)
Admission: EM | Admit: 2021-01-29 | Discharge: 2021-01-29 | Disposition: A | Discharge status: Home / Self Care | Payer: Medicaid Other | Attending: Emergency Medicine | Admitting: Emergency Medicine

## 2021-01-29 ENCOUNTER — Emergency Department (HOSPITAL_COMMUNITY): Payer: Medicaid Other

## 2021-01-29 DIAGNOSIS — T8332XA Displacement of intrauterine contraceptive device, initial encounter: Secondary | ICD-10-CM | POA: Insufficient documentation

## 2021-01-29 DIAGNOSIS — Z30431 Encounter for routine checking of intrauterine contraceptive device: Secondary | ICD-10-CM | POA: Diagnosis not present

## 2021-01-29 DIAGNOSIS — J45909 Unspecified asthma, uncomplicated: Secondary | ICD-10-CM | POA: Diagnosis not present

## 2021-01-29 DIAGNOSIS — Y829 Unspecified medical devices associated with adverse incidents: Secondary | ICD-10-CM | POA: Insufficient documentation

## 2021-01-29 DIAGNOSIS — Z87891 Personal history of nicotine dependence: Secondary | ICD-10-CM | POA: Insufficient documentation

## 2021-01-29 LAB — PREGNANCY, URINE: Preg Test, Ur: POSITIVE — AB

## 2021-01-29 NOTE — ED Provider Notes (Signed)
[facility] EMERGENCY DEPARTMENT Provider Note   CSN: [id number] Arrival date & time: 01/29/21  1404     History Chief Complaint  Patient presents with   IUD problems    Natalie [name] is a 20 y.o. female who presents to the ED today with complaint of IUD issue.  Patient states she had an IUD placed on 05/14 after having her child.  She states that she started having a menstrual cycle on 06/23 and has continued to bleed intermittently since then.  States her menstrual cycle has not regulated yet.  She states she went to take the tampon out earlier today without issue.  She looked down and noticed blue strings coming out of her vagina.  She tried to tug on the strings however felt resistance and stopped thinking this was likely her IUD and prompted ED visit.  She denies any abdominal pain, nausea, vomiting, pelvic pain, discharge, urinary symptoms, any other associated symptoms.   The history is provided by the patient and medical records.      Past Medical History:  Diagnosis Date   Allergy    Asthma    Tonsillitis    Trichomoniasis     Patient Active Problem List   Diagnosis Date Noted   Cesarean delivery delivered 12/10/2020   IUD (intrauterine device) in place 12/10/2020   Normal labor 12/09/2020   Sciatica of right side 11/07/2020   Leg cramps in pregnancy 11/07/2020   Anemia affecting pregnancy in third trimester 10/04/2020   Asthma affecting pregnancy in first trimester 06/27/2020   Supervision of normal first pregnancy, antepartum 05/04/2020   Hidradenitis suppurativa 10/08/2019   Dysmenorrhea 05/02/2015   Asthma, mild intermittent, well-controlled 10/15/2013   Allergic rhinitis 10/15/2013    Past Surgical History:  Procedure Laterality Date   CESAREAN SECTION N/A 12/10/2020   Procedure: CESAREAN SECTION;  Surgeon: Myna Hidalgo, DO;  Location: [facility] LD ORS;  Service: Obstetrics;  Laterality: N/A;   HYDRADENITIS EXCISION Bilateral 12/08/2019    Procedure: WIDE EXCISION BILATERAL AXILLARY HIDRADENITIS;  Surgeon: Abigail Miyamoto, MD;  Location: [facility] OR;  Service: General;  Laterality: Bilateral;     OB History     Gravida  1   Para      Term      Preterm      AB      Living         SAB      IAB      Ectopic      Multiple      Live Births              Family History  Problem Relation Age of Onset   Learning disabilities Mother    Mental illness Mother        Depression   Diabetes Paternal Aunt    Asthma Maternal Grandmother    Hypertension Maternal Grandmother    Learning disabilities Maternal Grandmother    Mental illness Maternal Grandmother        Depression   Stroke Maternal Grandmother    Hypertension Maternal Grandfather    Alcohol abuse Maternal Grandfather    Drug abuse Maternal Grandfather     Social History   Tobacco Use   Smoking status: Former    Pack years: 0.00    Types: Cigars   Smokeless tobacco: Never   Tobacco comments:    last used 03-24-20  Vaping Use   Vaping Use: Every day   Substances: Nicotine  Substance Use Topics  Alcohol use: Not Currently    Comment: last used 03-23-20   Drug use: Not Currently    Types: Marijuana    Comment: last used 03-23-20    Home Medications Prior to Admission medications   Medication Sig Start Date End Date Taking? Authorizing Provider  acetaminophen (TYLENOL) 325 MG tablet Take 2 tablets (650 mg total) by mouth every 6 (six) hours. 12/13/20   Sheila Oats, MD  albuterol (VENTOLIN HFA) 108 [phone] Base) MCG/ACT inhaler 2 puffs every 4-6 hours when wheezing 10/17/20   Raelyn Mora, CNM  cetirizine (ZYRTEC) 10 MG tablet Take one tablet by mouth once daily for allergies 05/21/19   Alfonso Ramus T, FNP  coconut oil OIL Apply 1 application topically as needed (nipple pain). 12/13/20   Sheila Oats, MD  ferrous sulfate (FERROUSUL) 325 (65 FE) MG tablet Take 1 tablet (325 mg total) by mouth every other day. 12/13/20   Sheila Oats,  MD  ibuprofen (ADVIL) 600 MG tablet Take 1 tablet (600 mg total) by mouth every 6 (six) hours. 12/13/20   Sheila Oats, MD  oxyCODONE (OXY IR/ROXICODONE) 5 MG immediate release tablet Take 1 tablet (5 mg total) by mouth every 6 (six) hours as needed for moderate pain, severe pain or breakthrough pain. 12/13/20   Sheila Oats, MD  Prenatal Vit-Fe Fumarate-FA (PRENATAL MULTIVITAMIN) TABS tablet Take 1 tablet by mouth daily at 12 noon. Patient not taking: Reported on 12/19/2020    [provider]    Allergies    Pollen extract  Review of Systems   Review of Systems  Constitutional:  Negative for chills and fever.  Gastrointestinal:  Negative for abdominal pain, nausea and vomiting.  Genitourinary:  Negative for pelvic pain and vaginal discharge.       + IUD problem  All other systems reviewed and are negative.  Physical Exam Updated Vital Signs BP 110/65 (BP Location: Right Arm)   Pulse 72   Temp 98 F (36.7 C) (Oral)   Resp 16   SpO2 99%   Physical Exam Vitals and nursing note reviewed.  Constitutional:      Appearance: She is not ill-appearing.  HENT:     Head: Normocephalic and atraumatic.  Eyes:     Conjunctiva/sclera: Conjunctivae normal.  Cardiovascular:     Rate and Rhythm: Normal rate and regular rhythm.  Pulmonary:     Effort: Pulmonary effort is normal.     Breath sounds: Normal breath sounds.  Abdominal:     Palpations: Abdomen is soft.     Tenderness: There is no abdominal tenderness.  Genitourinary:    Comments: Pelvic exam performed with chaperone at bedside; IUD strings visualized externally; does not appear they were cut upon placement on IUD. Os is closed.  Musculoskeletal:     Cervical back: Neck supple.  Skin:    General: Skin is warm and dry.  Neurological:     Mental Status: She is alert.    ED Results / Procedures / Treatments   Labs (all labs ordered are listed, but only abnormal results are displayed) Labs Reviewed  PREGNANCY,  URINE - Abnormal; Notable for the following components:      Result Value   Preg Test, Ur POSITIVE (*)    All other components within normal limits  POC URINE PREG, ED    EKG None  Radiology US PELVIC COMPLETE WITH TRANSVAGINAL  Result Date: 01/29/2021 CLINICAL DATA:  20 year old female with possible abnormal IUD placement. EXAM: TRANSABDOMINAL  AND TRANSVAGINAL ULTRASOUND OF PELVIS TECHNIQUE: Both transabdominal and transvaginal ultrasound examinations of the pelvis were performed. Transabdominal technique was performed for global imaging of the pelvis including uterus, ovaries, adnexal regions, and pelvic cul-de-sac. It was necessary to proceed with endovaginal exam following the transabdominal exam to visualize the ovaries and endometrium. COMPARISON:  None FINDINGS: Uterus Measurements: 9.1 x 4.1 x 6 cm = volume: 117 mL. No fibroids or other mass visualized. Endometrium Thickness: 2 mm. An IUD is noted within the LOWER uterine segment. No focal abnormality visualized. Right ovary Measurements: 3.7 x 2.4 x 2.6 cm = volume: 12 mL. Normal appearance/no adnexal mass. Left ovary Measurements: 3.1 x 2.3 x 1.6 cm = volume: 6.2 mL. Normal appearance/no adnexal mass. Other findings Trace free fluid noted. IMPRESSION: 1. Abnormal IUD position in the LOWER uterine segment. 2. No other significant abnormalities. Electronically Signed   By: Harmon Pier M.D.   On: 01/29/2021 17:13    Procedures Procedures   Medications Ordered in ED Medications - No data to display  ED Course  I have reviewed the triage vital signs and the nursing notes.  Pertinent labs & imaging results that were available during my care of the patient were reviewed by me and considered in my medical decision making (see chart for details).    MDM Rules/Calculators/A&P                          20 year old female who presents to the ED today after noticing IUD strings coming out of her vagina.  On arrival to the ED today vitals are  stable and patient appears to be in no acute distress.  She had a pelvic ultrasound performed while in the waiting room which does show abnormal IUD position in the lower uterine segment.  She denies any pelvic pain, abdominal pain, any other additional symptoms.  We will plan for pelvic exam at this time for further visualization and consult OB/GYN for further recommendations/follow-up in the outpatient setting for IUD removal/repositioning   Discussed case with OBGYN Dr. Jolayne Panther who recommends outpatient follow up where pt received her prenatal care.  Urine preg test returned positive in error; spoke with lab who states pt's urine was collected with another pt's label. I do not feel pt requires pregnancy test in the ED today. Will discharge with close OBGYN follow up. Pregnancy test disregarded in system.   This note was prepared using Dragon voice recognition software and may include unintentional dictation errors due to the inherent limitations of voice recognition software.  Final Clinical Impression(s) / ED Diagnoses Final diagnoses:  Displacement of intrauterine contraceptive device, initial encounter    Rx / DC Orders ED Discharge Orders     None        Discharge Instructions      Please follow up with your OBGYN next week for IUD replacement/trimming of the strings       Tanda Rockers, Cordelia Poche 01/29/21 2144    Wynetta Fines, MD 01/30/21 [phone]

## 2021-01-29 NOTE — ED Triage Notes (Signed)
Pt states IUD got stuck on tampon today, "kind of sticking out." Did not contact emergency OB line, came to ED.

## 2021-01-29 NOTE — Discharge Instructions (Addendum)
Please follow up with your OBGYN next week for IUD replacement/trimming of the strings

## 2021-01-29 NOTE — ED Provider Notes (Signed)
Emergency Medicine Provider Triage Evaluation Note  Zetta Stoneman , a 20 y.o. female  was evaluated in triage.  Pt complains of IUD problem..  Reports that earlier today.  Patient reports that after removing her tampon she noticed that she had a string sticking out of her vagina.   Patient reports that she tried pulling on the strings however felt like nothing was moving and stopped.  Denies any vaginal pain, vaginal bleeding, pelvic pain,abdominal pain, nausea, vomiting  Review of Systems  Positive:  Negative: Pain, vaginal bleeding, pelvic pain, abdominal pain, nausea, vomiting  Physical Exam  BP 100/60 (BP Location: Left Arm)   Pulse 68   Temp 98.4 F (36.9 C) (Oral)   Resp 16   SpO2 91%  Gen:   Awake, no distress   Resp:  Normal effort  MSK:   Moves extremities without difficulty  Other:  Abdomen soft, nondistended, nontender.  Medical Decision Making  Medically screening exam initiated at 3:56 PM.  Appropriate orders placed.  Brittinie Wherley was informed that the remainder of the evaluation will be completed by another provider, this initial triage assessment does not replace that evaluation, and the importance of remaining in the ED until their evaluation is complete.  The patient appears stable so that the remainder of the work up may be completed by another provider.      Haskel Schroeder, PA-C 01/29/21 1557    Koleen Distance, MD 01/29/21 [phone]

## 2021-01-31 ENCOUNTER — Telehealth: Payer: Self-pay | Admitting: *Deleted

## 2021-01-31 ENCOUNTER — Encounter: Payer: Self-pay | Admitting: Certified Nurse Midwife

## 2021-01-31 ENCOUNTER — Other Ambulatory Visit: Payer: Self-pay

## 2021-01-31 ENCOUNTER — Ambulatory Visit (INDEPENDENT_AMBULATORY_CARE_PROVIDER_SITE_OTHER): Payer: Medicaid Other | Admitting: Certified Nurse Midwife

## 2021-01-31 DIAGNOSIS — Z975 Presence of (intrauterine) contraceptive device: Secondary | ICD-10-CM | POA: Diagnosis not present

## 2021-01-31 MED ORDER — LEVONORGESTREL 20.1 MCG/DAY IU IUD
1.0000 | INTRAUTERINE_SYSTEM | Freq: Once | INTRAUTERINE | Status: AC
  Administered 2021-01-31: 1 via INTRAUTERINE

## 2021-01-31 NOTE — Progress Notes (Signed)
Post Partum Visit Note  Natalie [name] is a 20 y.o. G1P0 female who presents for a postpartum visit. She is 7 weeks postpartum following a primary cesarean section.  I have fully reviewed the prenatal and intrapartum course. The delivery was at 37.2 gestational weeks after spontaneous onset of labor with a breech presentation. Anesthesia: epidural. Postpartum course has been normal. Baby is doing well. Baby is feeding by bottle -   . Bleeding staining only. Bowel function is normal. Bladder function is normal. Patient is sexually active. Contraception method is IUD. Postpartum depression screening: negative.   The pregnancy intention screening data noted above was reviewed. Potential methods of contraception were discussed. The patient elected to proceed with IUD or IUS.    Edinburgh Postnatal Depression Scale - 01/31/21 1410       Edinburgh Postnatal Depression Scale:  In the Past 7 Days   I have been able to laugh and see the funny side of things. 0    I have looked forward with enjoyment to things. 0    I have blamed myself unnecessarily when things went wrong. 0    I have been anxious or worried for no good reason. 0    I have felt scared or panicky for no good reason. 0    Things have been getting on top of me. 0    I have been so unhappy that I have had difficulty sleeping. 0    I have felt sad or miserable. 0    I have been so unhappy that I have been crying. 0    The thought of harming myself has occurred to me. 0    Edinburgh Postnatal Depression Scale Total 0             Health Maintenance Due  Topic Date Due   DTAP VACCINES (1) 01/03/2001   COVID-19 Vaccine (1) Never done    The following portions of the patient's history were reviewed and updated as appropriate: allergies, current medications, past family history, past medical history, past social history, past surgical history, and problem list.  Review of Systems Pertinent items noted in HPI and remainder of  comprehensive ROS otherwise negative.  Objective:  BP 100/64   Pulse 96   Wt 168 lb 14.4 oz (76.6 kg)   Breastfeeding No   BMI 31.91 kg/m    General:  alert, cooperative, appears stated age, and no distress   Breasts:  normal  Lungs: Normal effort  Heart:  regular rate and rhythm  Abdomen: Soft and non-tender    Wound N/A  GU exam:   Normal, but strings hanging from vagina. Pt had cut them previously. Per ED note, IUD found to be in the lower uterine segment after pt used a tampon with un-trimmed strings and partially dislodged her IUD   IUD Removal  Patient identified, informed consent performed, consent signed. Patient was in the dorsal lithotomy position, normal external genitalia was noted.  A speculum was placed in the patient's vagina, normal discharge was noted, no lesions. The multiparous cervix was visualized, no lesions, no abnormal discharge.  The strings of the IUD were grasped and pulled using ring forceps. The IUD was removed in its entirety.   IUD Insertion  Discussed risks of irregular bleeding, cramping, infection, malpositioning or misplacement of the IUD outside the uterus which may require further procedure such as laparoscopy. Also discussed >99% contraception efficacy, increased risk of ectopic pregnancy with failure of method.   Emphasized that  this did not protect against STIs, condoms recommended during all sexual encounters. Time out was performed. UPT at ED visit was positive but no HCG drawn at the time. Pt states she has had intermittent bleeding since she delivered, no unprotected sex since dislodging her IUD on Sunday and strongly desires IUD today even as emergency contraceptive. bHCG drawn. Pt strongly desired emergency contraceptive via hormonal IUD. Explained at length possibility of pregnancy and risk associated with using Liletta for EC. Pt re-emphasized her desire for IUD replacement today.  Speculum in the vagina.  Cervix visualized.  Cleaned with  Betadine x 2.  Grasped anteriorly with a single tooth tenaculum.  Uterus sounded to 6 cm.  Liletta IUD placed per manufacturer's recommendations.  Strings trimmed to 3 cm. Tenaculum was removed, good hemostasis noted.  Patient tolerated procedure well.   Patient was given post-procedure instructions.  She was advised to have backup contraception for one week.  Patient was also asked to check IUD strings periodically and follow up in 4 weeks for IUD check. Will follow results of bHCG and manage accordingly.  Assessment:   Postpartum care and examination IUD contraception (removal and reinsertion)  Plan:   Essential components of care per ACOG recommendations:  1.  Mood and well being: Patient with negative depression screening today. Reviewed local resources for support.  - Patient tobacco use? No.   - hx of drug use? No.    2. Infant care and feeding:  -Patient currently breastmilk feeding? No.  -Social determinants of health (SDOH) reviewed in EPIC. No concerns  3. Sexuality, contraception and birth spacing - Patient does want a pregnancy in the next year.  Desired family size is 1 children.  - Reviewed forms of contraception in tiered fashion. Patient desired IUD today.   - Discussed birth spacing of 18 months  4. Sleep and fatigue -Encouraged family/partner/community support of 4 hrs of uninterrupted sleep to help with mood and fatigue  5. Physical Recovery  - Discussed patients delivery and complications. She describes her labor as bad. - Patient had a  C-section for breech presentation after laboring to 5cm .  - Patient has urinary incontinence? No. - Patient is safe to resume physical and sexual activity with back up method for one week.  6.  Health Maintenance - HM due items addressed Yes - Pap smear not done at today's visit. Not indicated due to age. -Breast Cancer screening indicated? No.   7. Chronic Disease/Pregnancy Condition follow up: None - PCP follow up as  needed  Follow up in one month for IUD string check.  Edd Arbour, CNM, MSN, IBCLC Certified Nurse Midwife, [facility]

## 2021-01-31 NOTE — Telephone Encounter (Signed)
Transition Care Management Unsuccessful Follow-up Telephone Call  Date of discharge and from where:  01/29/2021 [facility]  Attempts:  1st Attempt  Reason for unsuccessful TCM follow-up call:  Left voice message

## 2021-02-01 LAB — BETA HCG QUANT (REF LAB): hCG Quant: <1 m[IU]/mL

## 2021-02-01 NOTE — Telephone Encounter (Signed)
Transition Care Management Unsuccessful Follow-up Telephone Call  Date of discharge and from where:  01/29/2021 - [facility]  Attempts:  2nd Attempt  Reason for unsuccessful TCM follow-up call:  Left voice message 

## 2021-02-02 NOTE — Telephone Encounter (Signed)
Transition Care Management Unsuccessful Follow-up Telephone Call  Date of discharge and from where:  01/29/2021 [name]  Attempts:  3rd Attempt  Reason for unsuccessful TCM follow-up call:  Left voice message

## 2021-02-14 IMAGING — US US OB LIMITED
1 series · 9 of 9 positions shown · non-contrast
Comparison: none

[Series 1: us ob limited · 9 of 9 slices shown]
[im 1/9]
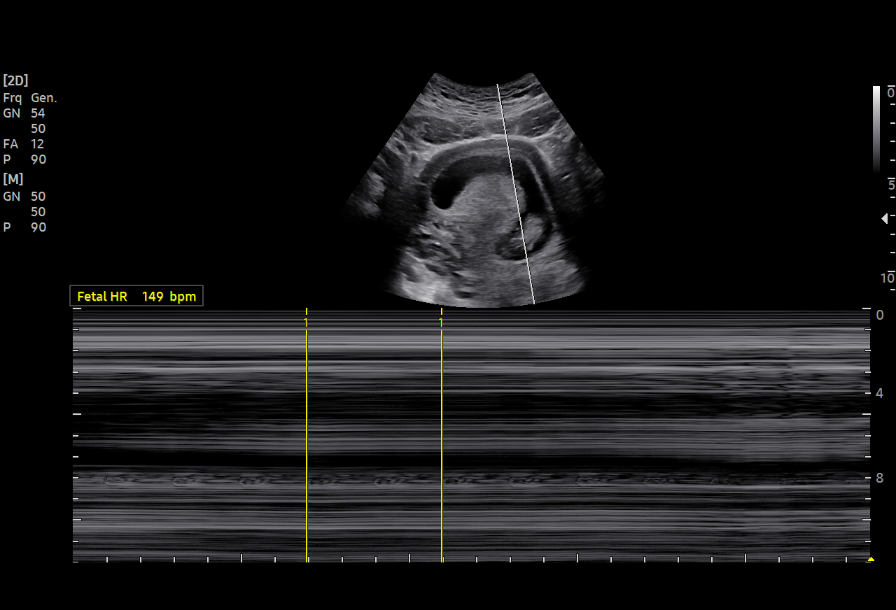
[im 2/9]
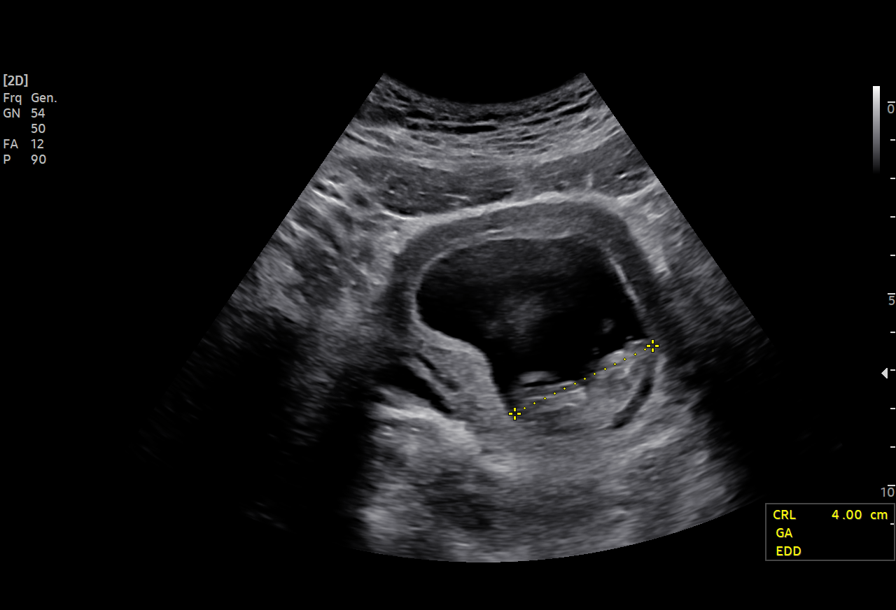
[im 3/9]
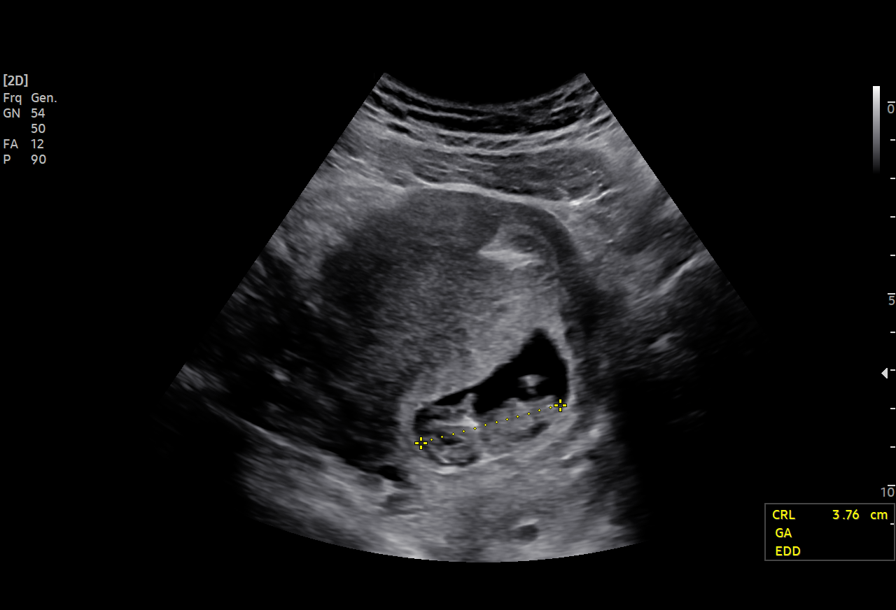
[im 4/9]
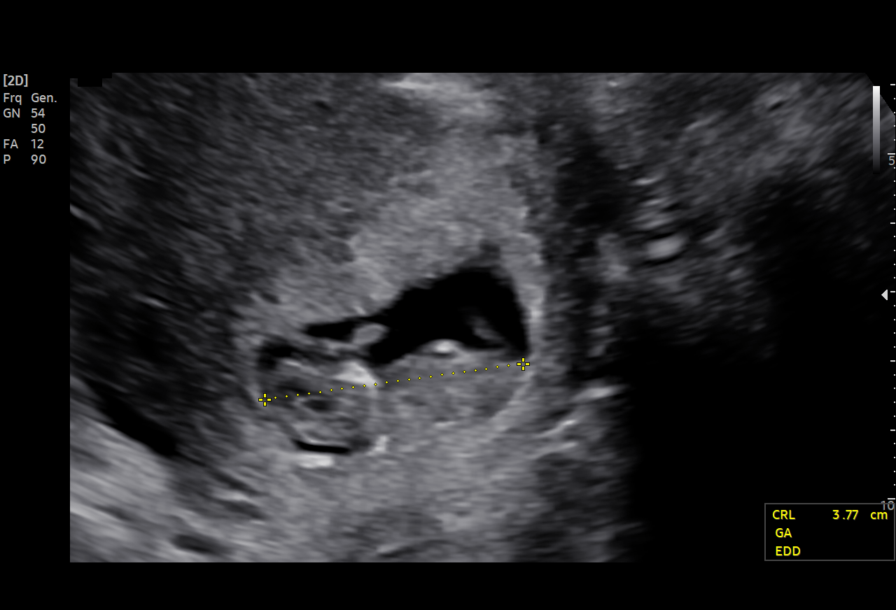
[im 5/9]
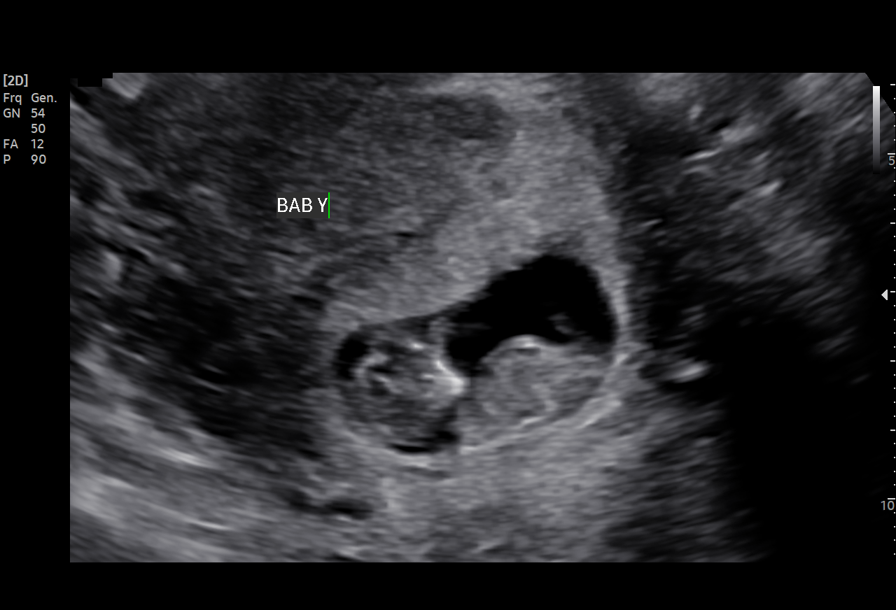
[im 6/9]
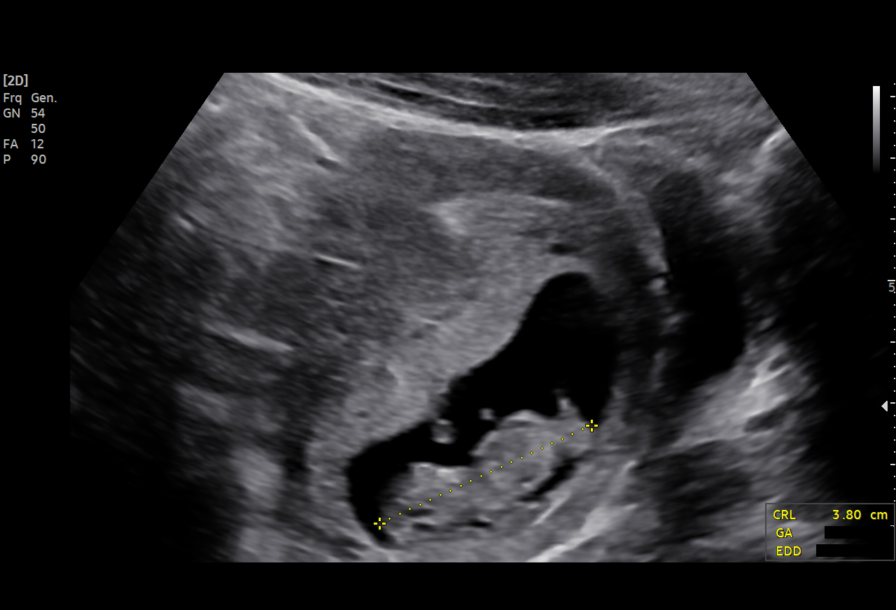
[im 7/9]
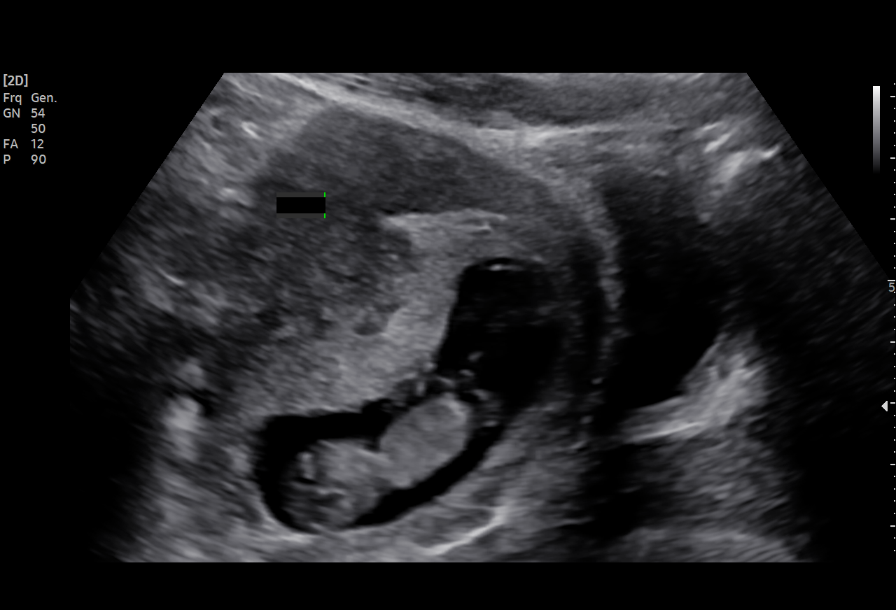
[im 8/9]
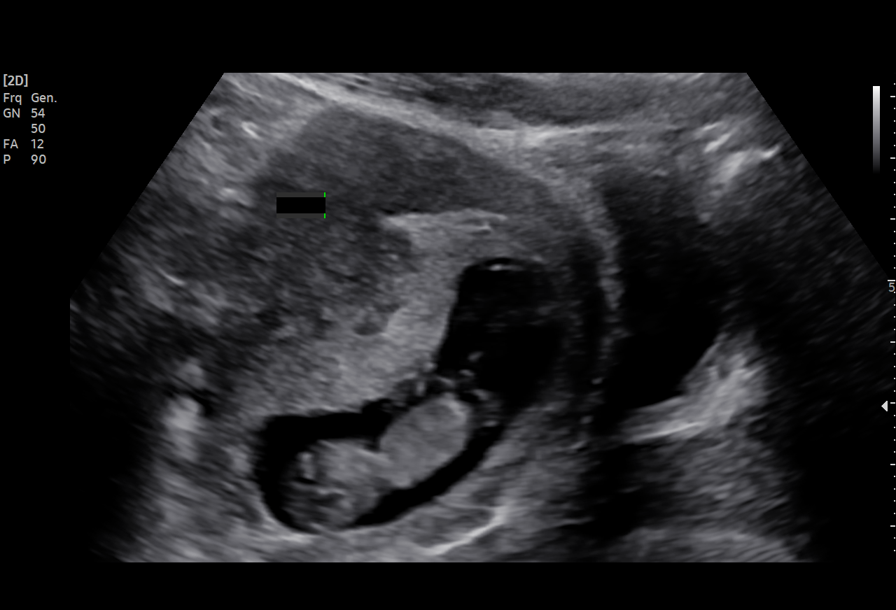
[im 9/9]
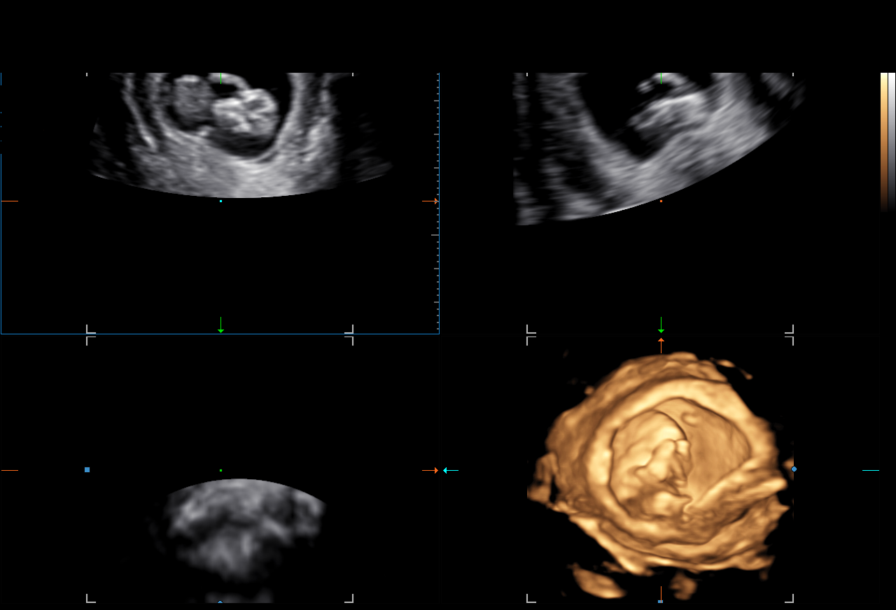

[9 of 9 positions shown; findings below may reference images not displayed]

Healthcare

 1   [HOSPITAL]                        [address].0      [facility]

Indications

 10 weeks gestation of pregnancy
Fetal Evaluation

 Num Of Fetuses:          1
 Fetal Heart Rate(bpm):   149
 Cardiac Activity:        Observed
Biometry

 CRL:      37.8   mm     G. Age:  10w 4d                   EDD:   12/31/20
Gestational Age

 Best:           10w 4d    Det. By:  U/S C R L (06/08/20)       EDD:  12/31/20
Comments

 Single live IUP at 77w2d by CRL. LMP 03/24/20
Impression

 [place] live IUP at 77w2d by CRL. Start prenatal care.

## 2021-03-01 ENCOUNTER — Other Ambulatory Visit (HOSPITAL_COMMUNITY)
Admission: RE | Admit: 2021-03-01 | Discharge: 2021-03-01 | Disposition: A | Discharge status: Home / Self Care | Payer: Medicaid Other | Source: Ambulatory Visit | Attending: Family Medicine | Admitting: Family Medicine

## 2021-03-01 ENCOUNTER — Ambulatory Visit (INDEPENDENT_AMBULATORY_CARE_PROVIDER_SITE_OTHER): Payer: Medicaid Other | Admitting: Family Medicine

## 2021-03-01 ENCOUNTER — Encounter: Payer: Self-pay | Admitting: Family Medicine

## 2021-03-01 ENCOUNTER — Other Ambulatory Visit: Payer: Self-pay

## 2021-03-01 VITALS — BP 112/72 | Wt 166.4 lb

## 2021-03-01 DIAGNOSIS — F3289 Other specified depressive episodes: Secondary | ICD-10-CM

## 2021-03-01 DIAGNOSIS — Z975 Presence of (intrauterine) contraceptive device: Secondary | ICD-10-CM

## 2021-03-01 DIAGNOSIS — N898 Other specified noninflammatory disorders of vagina: Secondary | ICD-10-CM | POA: Diagnosis not present

## 2021-03-01 DIAGNOSIS — Z30431 Encounter for routine checking of intrauterine contraceptive device: Secondary | ICD-10-CM

## 2021-03-01 MED ORDER — FLUCONAZOLE 150 MG PO TABS: 150.0000 mg | ORAL_TABLET | Freq: Once | ORAL | 0 refills | Status: AC

## 2021-03-01 MED ORDER — SERTRALINE HCL 50 MG PO TABS: 50.0000 mg | ORAL_TABLET | Freq: Every day | ORAL | 5 refills | Status: DC

## 2021-03-01 NOTE — Progress Notes (Signed)
Patient in today for string check. States that appx 4 days ago she started having discharge. Per patient this started as "watery but then changed to thick, white, chunks"  Discharge has an odor to it, patient complains of constant irritation. Has 1 sexually active partner but has concerns for STD's, would like to be tested today. No concerns with IUD at this time.

## 2021-03-01 NOTE — Progress Notes (Signed)
GYNECOLOGY OFFICE VISIT NOTE  History:   Natalie [name] is a 20 y.o. G1P0 here today for IUD string check.  Post placental IUD accidentally dislodged, at last visit on 01/31/21 it was removed and replaced Reports no issues currently with IUD She is concerned however about possible STI exposure Recently learned her partner has been unfaithful Reports some vaginal itching and burning, no discharge  Does endorse depressive symptoms Says she cries sometimes without knowing why Mostly just spends time alone in her room Denies SI  Health Maintenance Due  Topic Date Due   DTAP VACCINES (1) 01/03/2001   COVID-19 Vaccine (1) Never done   INFLUENZA VACCINE  02/27/2021    Past Medical History:  Diagnosis Date   Allergy    Anemia affecting pregnancy in third trimester 10/04/2020   Asthma    Cesarean delivery delivered 12/10/2020   Breech presentation   Sciatica of right side 11/07/2020   Tonsillitis    Trichomoniasis     Past Surgical History:  Procedure Laterality Date   CESAREAN SECTION N/A 12/10/2020   Procedure: CESAREAN SECTION;  Surgeon: Myna Hidalgo, DO;  Location: [facility];  Service: Obstetrics;  Laterality: N/A;   HYDRADENITIS EXCISION Bilateral 12/08/2019   Procedure: WIDE EXCISION BILATERAL AXILLARY HIDRADENITIS;  Surgeon: Abigail Miyamoto, MD;  Location: [facility] OR;  Service: General;  Laterality: Bilateral;    The following portions of the patient's history were reviewed and updated as appropriate: allergies, current medications, past family history, past medical history, past social history, past surgical history and problem list.   Health Maintenance:   Last pap: No results found for: DIAGPAP, HPV, HPVHIGH N/a  Last mammogram:  N/a    Review of Systems:  Pertinent items noted in HPI and remainder of comprehensive ROS otherwise negative.  Physical Exam:  BP 112/72   Wt 166 lb 6.4 oz (75.5 kg)   LMP 02/15/2021 (Approximate)   Breastfeeding No   BMI 31.44  kg/m  CONSTITUTIONAL: Well-developed, well-nourished female in no acute distress.  HEENT:  Normocephalic, atraumatic. External right and left ear normal. No scleral icterus.  NECK: Normal range of motion, supple, no masses noted on observation SKIN: No rash noted. Not diaphoretic. No erythema. No pallor. MUSCULOSKELETAL: Normal range of motion. No edema noted. NEUROLOGIC: Alert and oriented to person, place, and time. Normal muscle tone coordination.  PSYCHIATRIC: Normal mood and affect. Normal behavior. Normal judgment and thought content. RESPIRATORY: Effort normal, no problems with respiration noted ABDOMEN: No masses noted. No other overt distention noted.   PELVIC: Normal appearing external genitalia; normal appearing vaginal mucosa and cervix.  No abnormal discharge noted. IUD strings present, length c/w prior note  Labs and Imaging No results found for this or any previous visit (from the past 168 hour(s)). No results found.    Assessment and Plan:   Problem List Items Addressed This Visit       Other   IUD (intrauterine device) in place    Strings in place and appropriate length Per patient request vaginal swab collected. Empiric treatment for yeast given symptoms of burning/itching.       Depression    Endorsing depressive symptoms. Accepts zoloft and referral to [facility]. Discussed contacting us if she has SI. Follow up mood in 6 weeks and see if dose increase is needed for zoloft.        Relevant Medications   sertraline (ZOLOFT) 50 MG tablet   Other Relevant Orders   Ambulatory referral to [facility]  Other Visit Diagnoses     IUD check up    -  Primary   Vaginal discharge       Relevant Orders   Cervicovaginal ancillary only( [place])       Routine preventative health maintenance measures emphasized. Please refer to After Visit Summary for other counseling recommendations.   Return in about 6 weeks (around 04/12/2021) for f/u mood.     Total face-to-face time with patient: 20 minutes.  Over 50% of encounter was spent on counseling and coordination of care.   Venora Maples, MD/MPH Attending Family Medicine Physician, [facility] for [facility], [facility]

## 2021-03-02 DIAGNOSIS — F32A Depression, unspecified: Secondary | ICD-10-CM | POA: Insufficient documentation

## 2021-03-02 LAB — CERVICOVAGINAL ANCILLARY ONLY
Bacterial Vaginitis (gardnerella): POSITIVE — AB
Candida Glabrata: NEGATIVE
Candida Vaginitis: POSITIVE — AB
Chlamydia: NEGATIVE
Comment: NEGATIVE
Comment: NEGATIVE
Comment: NEGATIVE
Comment: NEGATIVE
Comment: NEGATIVE
Comment: NORMAL
Neisseria Gonorrhea: NEGATIVE
Trichomonas: NEGATIVE

## 2021-03-02 MED ORDER — METRONIDAZOLE 500 MG PO TABS: 500.0000 mg | ORAL_TABLET | Freq: Two times a day (BID) | ORAL | 0 refills | Status: AC

## 2021-03-02 MED ORDER — FLUCONAZOLE 150 MG PO TABS: 150.0000 mg | ORAL_TABLET | Freq: Once | ORAL | 0 refills | Status: AC

## 2021-03-02 NOTE — Assessment & Plan Note (Addendum)
Strings in place and appropriate length Per patient request vaginal swab collected. Empiric treatment for yeast given symptoms of burning/itching.

## 2021-03-02 NOTE — BH Specialist Note (Signed)
Pt did not arrive to video visit and did not answer the phone; Left HIPPA-compliant message to call back Kalisi Bevill from [facility] at  [phone] (Dontaye Hur's office).  ?; left MyChart message for patient.  ? ?

## 2021-03-02 NOTE — Assessment & Plan Note (Signed)
Endorsing depressive symptoms. Accepts zoloft and referral to [facility]. Discussed contacting us if she has SI. Follow up mood in 6 weeks and see if dose increase is needed for zoloft.

## 2021-03-02 NOTE — Addendum Note (Signed)
Addended by: Merian Capron on: 03/02/2021 01:53 PM   Modules accepted: Orders

## 2021-03-09 ENCOUNTER — Ambulatory Visit: Payer: Medicaid Other | Admitting: Clinical

## 2021-03-09 DIAGNOSIS — Z91199 Patient's noncompliance with other medical treatment and regimen due to unspecified reason: Secondary | ICD-10-CM

## 2021-03-09 DIAGNOSIS — Z5329 Procedure and treatment not carried out because of patient's decision for other reasons: Secondary | ICD-10-CM

## 2021-04-12 ENCOUNTER — Encounter: Payer: Self-pay | Admitting: Family Medicine

## 2021-04-12 ENCOUNTER — Other Ambulatory Visit: Payer: Self-pay

## 2021-04-12 ENCOUNTER — Ambulatory Visit (INDEPENDENT_AMBULATORY_CARE_PROVIDER_SITE_OTHER): Payer: Medicaid Other | Admitting: Family Medicine

## 2021-04-12 DIAGNOSIS — F3289 Other specified depressive episodes: Secondary | ICD-10-CM

## 2021-04-12 MED ORDER — SERTRALINE HCL 100 MG PO TABS: 100.0000 mg | ORAL_TABLET | Freq: Every day | ORAL | 1 refills | Status: DC

## 2021-04-12 NOTE — Assessment & Plan Note (Signed)
Significant improvement in symptoms and PHQ9 score. After discussion will increase to 100mg  daily. Given 6 months supply, shown [facility] portal for finding new PCP and encouraged to establish care. Return PRN for ob/gyn issues.

## 2021-04-12 NOTE — Progress Notes (Signed)
GYNECOLOGY OFFICE VISIT NOTE  History:   Natalie [name] is a 20 y.o. G1P0 here today for mood follow up.  Patient seen with significant depressive symptoms 6 weeks ago, started on zoloft  Today reports she feels significantly better since starting zoloft Denies SI Getting out of her room more Feels like there may be a little room for improvement Still has her days, but overall much better  PHQ9 SCORE ONLY 04/12/2021 03/01/2021 01/31/2021  PHQ-9 Total Score 1 13 3      Health Maintenance Due  Topic Date Due   DTAP VACCINES (1) 01/03/2001   COVID-19 Vaccine (1) Never done   INFLUENZA VACCINE  02/27/2021    Past Medical History:  Diagnosis Date   Allergy    Anemia affecting pregnancy in third trimester 10/04/2020   Asthma    Cesarean delivery delivered 12/10/2020   Breech presentation   Sciatica of right side 11/07/2020   Tonsillitis    Trichomoniasis     Past Surgical History:  Procedure Laterality Date   CESAREAN SECTION N/A 12/10/2020   Procedure: CESAREAN SECTION;  Surgeon: 12/12/2020, DO;  Location: [facility] LD ORS;  Service: Obstetrics;  Laterality: N/A;   HYDRADENITIS EXCISION Bilateral 12/08/2019   Procedure: WIDE EXCISION BILATERAL AXILLARY HIDRADENITIS;  Surgeon: 02/07/2020, MD;  Location: [facility] OR;  Service: General;  Laterality: Bilateral;    The following portions of the patient's history were reviewed and updated as appropriate: allergies, current medications, past family history, past medical history, past social history, past surgical history and problem list.   Health Maintenance:   Last pap: No results found for: DIAGPAP, HPV, HPVHIGH N/a  Last mammogram:  N/a    Review of Systems:  Pertinent items noted in HPI and remainder of comprehensive ROS otherwise negative.  Physical Exam:  BP 106/73   Pulse 78   Ht 5\' 1"  (1.549 m)   Wt 153 lb 14.4 oz (69.8 kg)   LMP  (LMP Unknown)   BMI 29.08 kg/m  CONSTITUTIONAL: Well-developed, well-nourished  female in no acute distress.  HEENT:  Normocephalic, atraumatic. External right and left ear normal. No scleral icterus.  NECK: Normal range of motion, supple, no masses noted on observation SKIN: No rash noted. Not diaphoretic. No erythema. No pallor. MUSCULOSKELETAL: Normal range of motion. No edema noted. NEUROLOGIC: Alert and oriented to person, place, and time. Normal muscle tone coordination.  PSYCHIATRIC: Normal mood and affect. Normal behavior. Normal judgment and thought content. RESPIRATORY: Effort normal, no problems with respiration noted   Labs and Imaging No results found for this or any previous visit (from the past 168 hour(s)). No results found.    Assessment and Plan:   Problem List Items Addressed This Visit       Other   Depression    Significant improvement in symptoms and PHQ9 score. After discussion will increase to 100mg  daily. Given 6 months supply, shown [facility] portal for finding new PCP and encouraged to establish care. Return PRN for ob/gyn issues.       Relevant Medications   sertraline (ZOLOFT) 100 MG tablet    Routine preventative health maintenance measures emphasized. Please refer to After Visit Summary for other counseling recommendations.   Return if symptoms worsen or fail to improve.    Total face-to-face time with patient: 10 minutes.  Over 50% of encounter was spent on counseling and coordination of care.   Abigail Miyamoto, MD/MPH Attending Family Medicine Physician, [facility] for [facility], [facility]

## 2021-04-30 IMAGING — US US MFM OB DETAIL+14 WK
1 series · 13 of 28 positions shown · non-contrast
Comparison: none

[Series 1: us mfm ob detail+14 wk · 128 acquisitions, 13 frames shown]
[im 5/128]
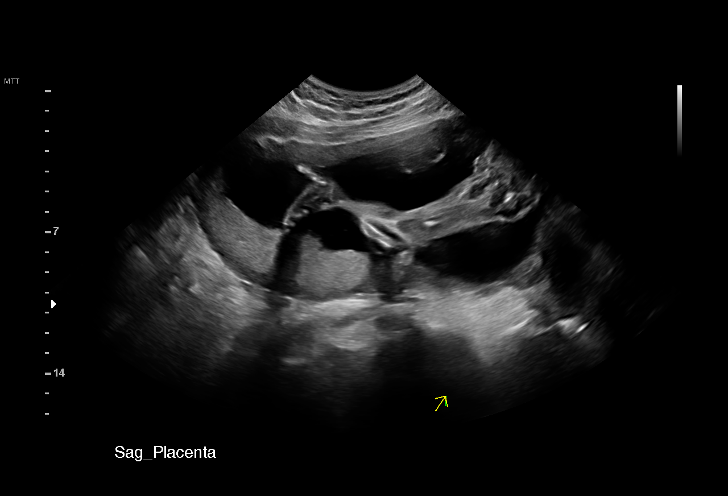
[im 15/128]
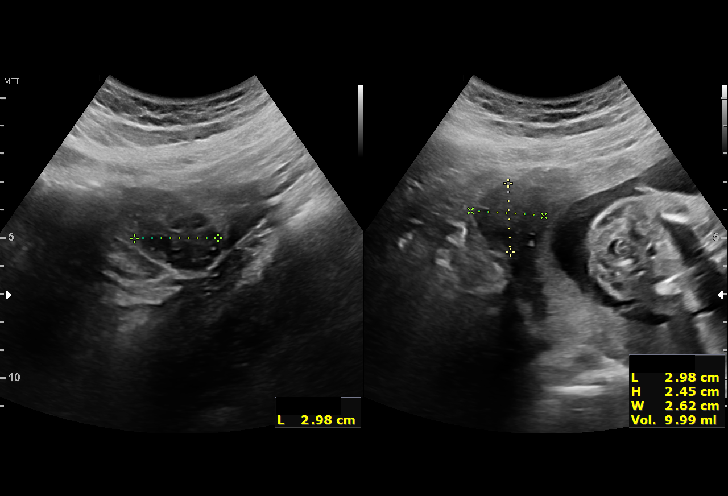
[im 24/128]
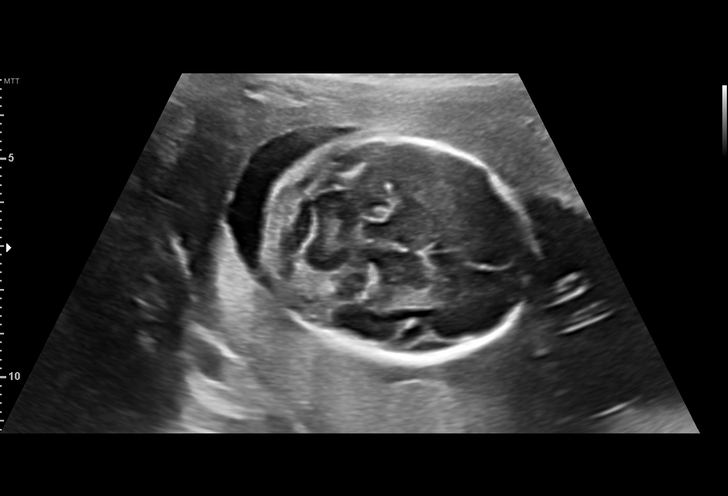
[im 33/128]
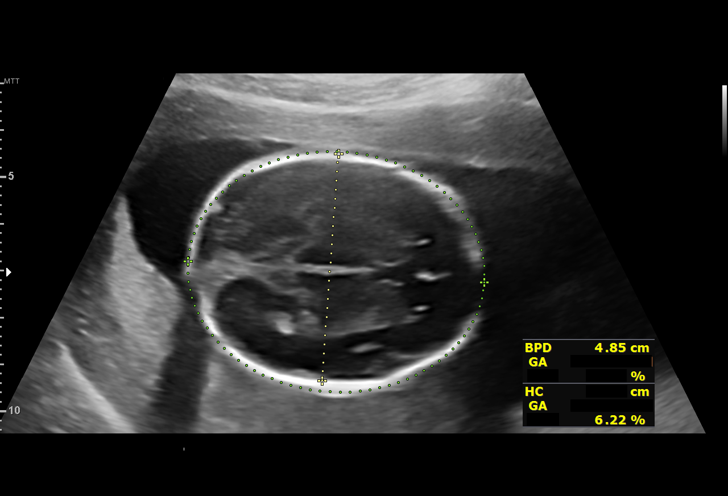
[im 43/128]
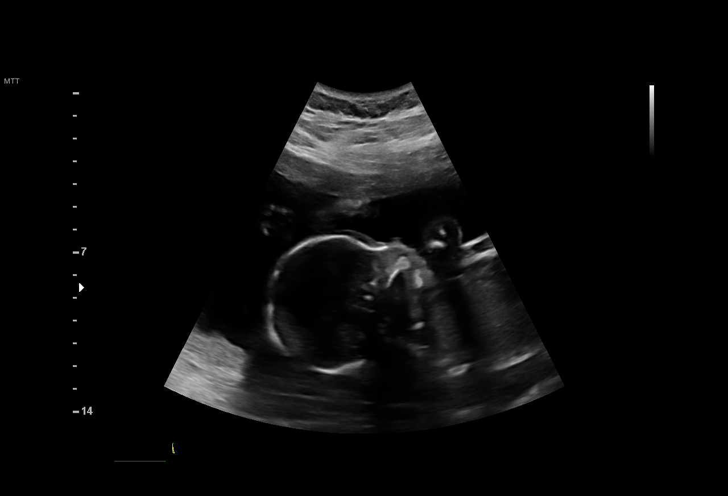
[im 52/128]
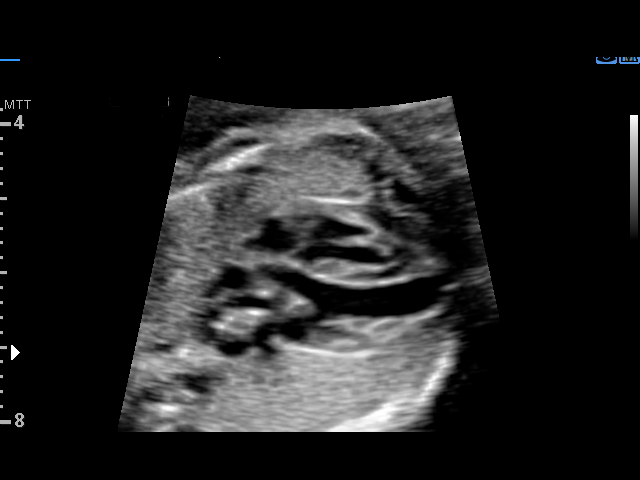
[im 66/128]
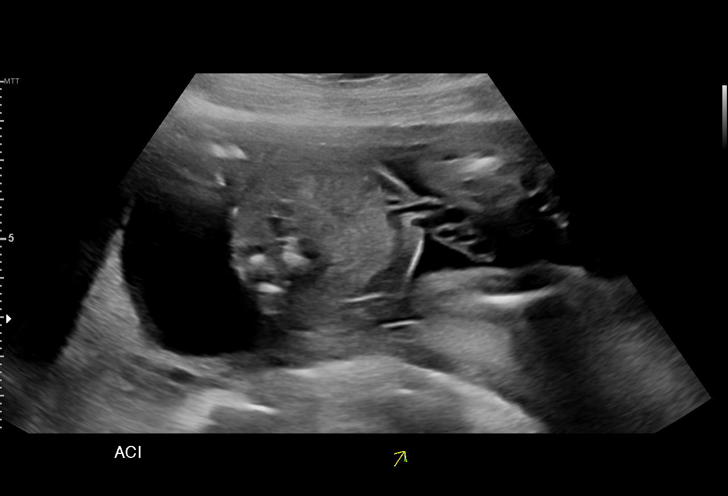
[im 76/128]
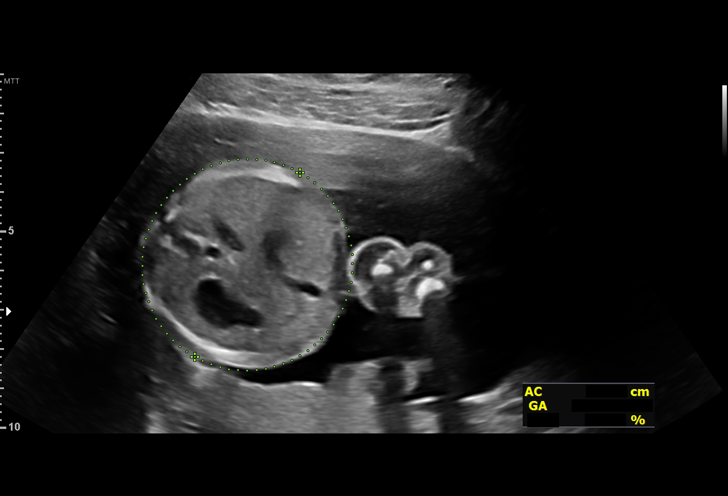
[im 85/128]
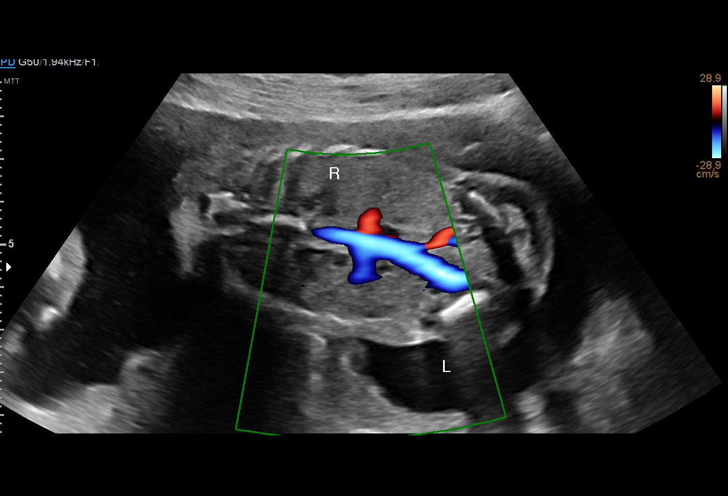
[im 95/128]
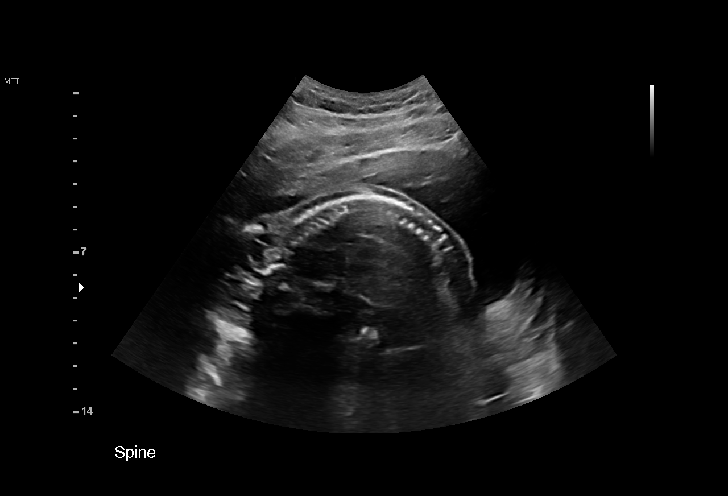
[im 104/128]
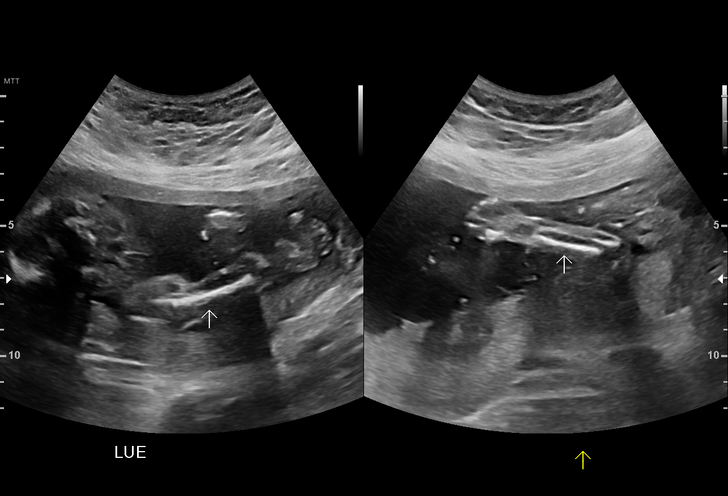
[im 113/128]
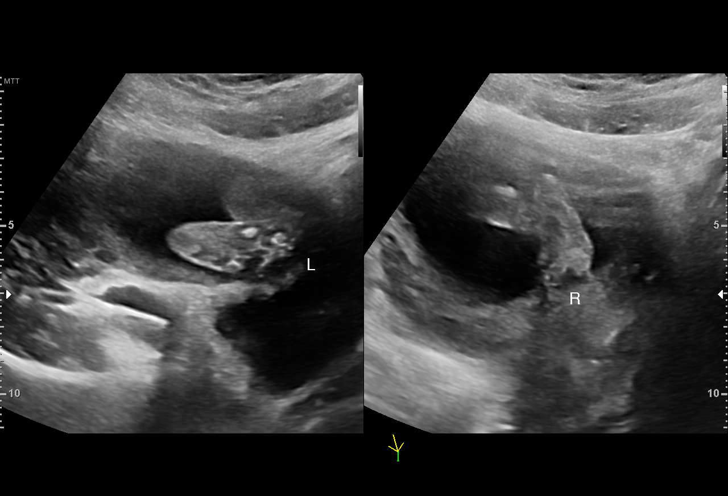
[im 123/128]
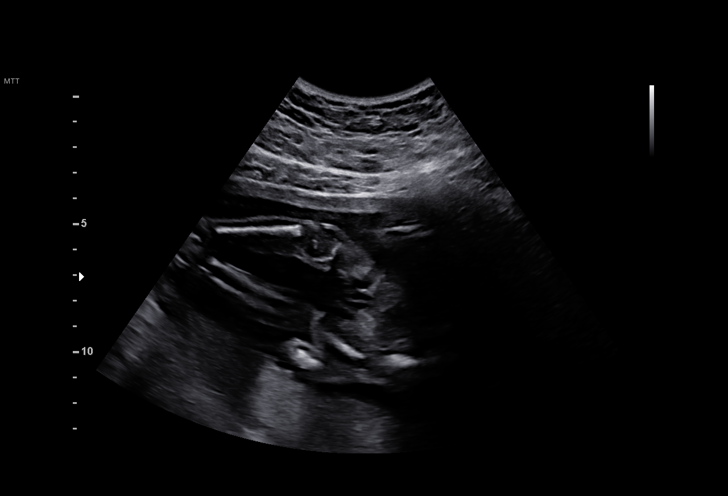

[13 of 28 positions shown; findings below may reference images not displayed]

Indications

 Obesity complicating pregnancy, second
 trimester (BMI 31)
 21 weeks gestation of pregnancy
 Genetic carrier (Isovoleric Acidemia)
 Other mental disorder complicating
 pregnancy, second trimester
 Asthma                                         R22.R2 j31.656
 LR NIPS
 Encounter for antenatal screening for
 malformations
Fetal Evaluation

 Num Of Fetuses:         1
 Fetal Heart Rate(bpm):  144
 Cardiac Activity:       Observed
 Presentation:           Breech
 Placenta:               Posterior
 P. Cord Insertion:      Visualized, central

 Amniotic Fluid
 AFI FV:      Within normal limits

                             Largest Pocket(cm)

Biometry
 BPD:      48.4  mm     G. Age:  20w 4d         23  %    CI:        72.43   %    70 - 86
                                                         FL/HC:      18.9   %    15.9 -
 HC:      180.9  mm     G. Age:  20w 4d         12  %    HC/AC:      1.09        1.06 -
 AC:      166.1  mm     G. Age:  21w 4d         55  %    FL/BPD:     70.7   %
 FL:       34.2  mm     G. Age:  20w 5d         23  %    FL/AC:      20.6   %    20 - 24
 HUM:      33.4  mm     G. Age:  21w 2d         48  %
 CER:      21.2  mm     G. Age:  20w 1d         25  %
 NFT:       4.2  mm

 LV:        6.4  mm
 CM:          4  mm

 Est. FW:     402  gm    0 lb 14 oz      37  %
OB History

 Blood Type:   O+
 Gravidity:    1         Term:   0        Prem:   0        SAB:   0
 TOP:          0       Ectopic:  0        Living: 0
Gestational Age

 LMP:           21w 4d        Date:  03/24/20                 EDD:   12/29/20
 U/S Today:     20w 6d                                        EDD:   01/03/21
 Best:          21w 2d     Det. By:  U/S C R L  (06/08/20)    EDD:   12/31/20
Anatomy

 Cranium:               Appears normal         Aortic Arch:            Not well visualized
 Cavum:                 Appears normal         Ductal Arch:            Not well visualized
 Ventricles:            Appears normal         Diaphragm:              Appears normal
 Choroid Plexus:        Appears normal         Stomach:                Appears normal, left
                                                                       sided
 Cerebellum:            Appears normal         Abdomen:                Appears normal
 Posterior Fossa:       Appears normal         Abdominal Wall:         Appears nml (cord
                                                                       insert, abd wall)
 Nuchal Fold:           Appears normal         Cord Vessels:           Appears normal (3
                                                                       vessel cord)
 Face:                  Appears normal         Kidneys:                Appear normal
                        (orbits and profile)
 Lips:                  Appears normal         Bladder:                Appears normal
 Thoracic:              Appears normal         Spine:                  Appears normal
 Heart:                 Appears normal         Upper Extremities:      Visualized
                        (4CH, axis, and
                        situs)
 RVOT:                  Appears normal         Lower Extremities:      Appears normal
 LVOT:                  Appears normal

 Other:  Fetus appears to be female. Nasal bone visualized. Heels appear
         normal. Technically difficult due to fetal Technically difficult due to
         maternal habitus and fetal position.
Cervix Uterus Adnexa

 Cervix
 Length:           3.26  cm.
 Normal appearance by transabdominal scan.
 Uterus
 No abnormality visualized.

 Right Ovary
 Within normal limits.

 Left Ovary
 Within normal limits.

 Cul De Sac
 No free fluid seen.

 Adnexa
 No abnormality visualized.
Comments

 This patient was seen for a detailed fetal anatomy scan due
 to maternal obesity.
 She denies any significant past medical history and denies
 any problems in her current pregnancy.
 She had a cell free DNA test earlier in her pregnancy which
 indicated a low risk for trisomy 21, 18, and 13. A female fetus
 is predicted.
 She was informed that the fetal growth and amniotic fluid
 level were appropriate for her gestational age.
 There were no obvious fetal anomalies noted on today's
 ultrasound exam.  However, the views of the fetal anatomy
 were limited today due to the fetal position.
 The patient was informed that anomalies may be missed due
 to technical limitations. If the fetus is in a suboptimal position
 or maternal habitus is increased, visualization of the fetus in
 the maternal uterus may be impaired.
 A follow-up exam was scheduled in 4 weeks to complete the
 views of the fetal anatomy.

## 2021-05-28 IMAGING — US US MFM OB FOLLOW-UP
1 series · 13 of 28 positions shown · non-contrast
Comparison: none

[Series 1: us mfm ob follow-up · 87 acquisitions, 13 frames shown]
[im 4/87]
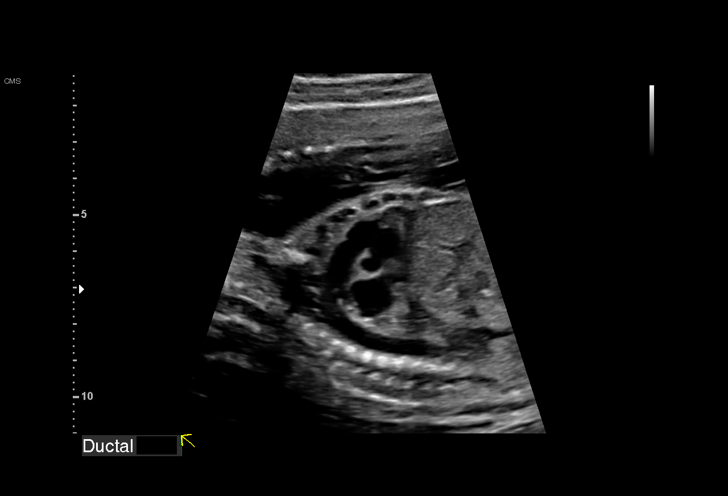
[im 10/87]
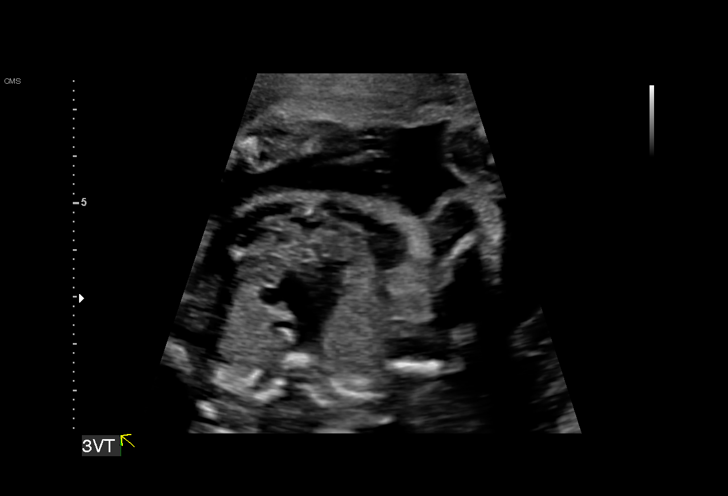
[im 16/87]
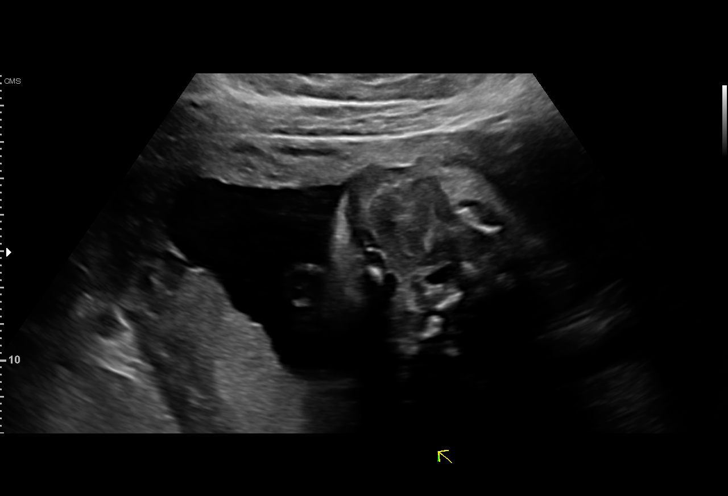
[im 23/87]
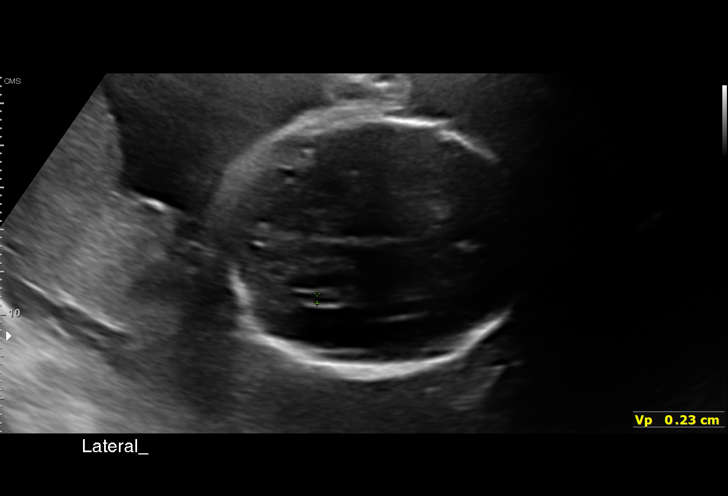
[im 29/87]
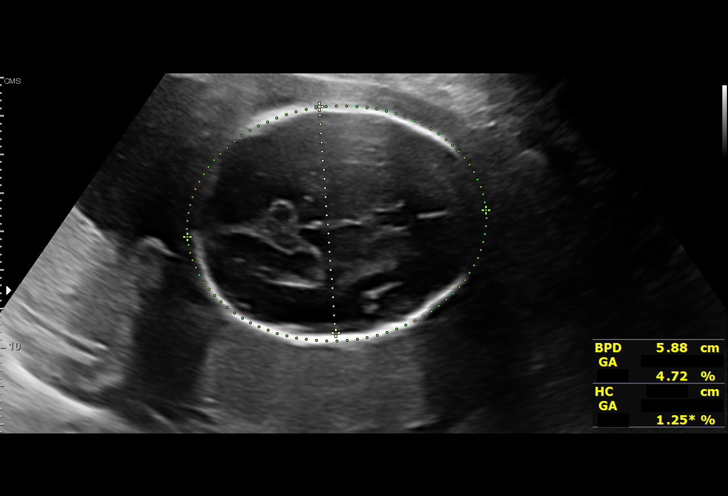
[im 36/87]
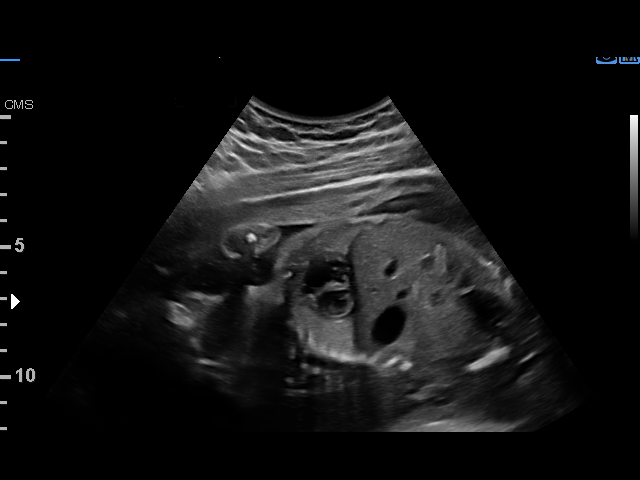
[im 45/87]
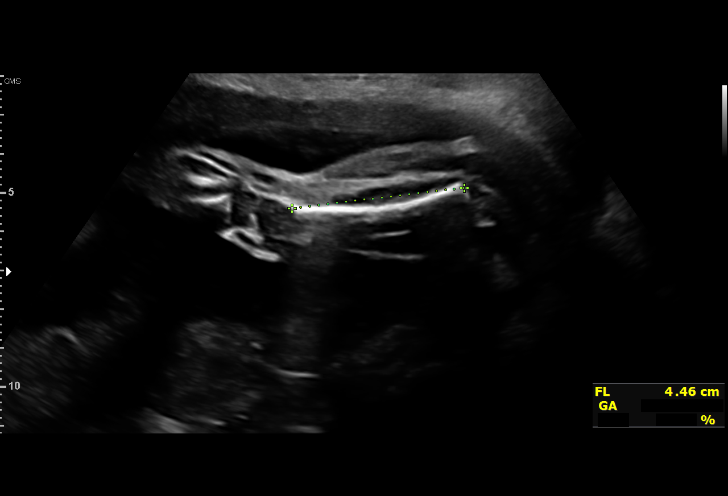
[im 51/87]
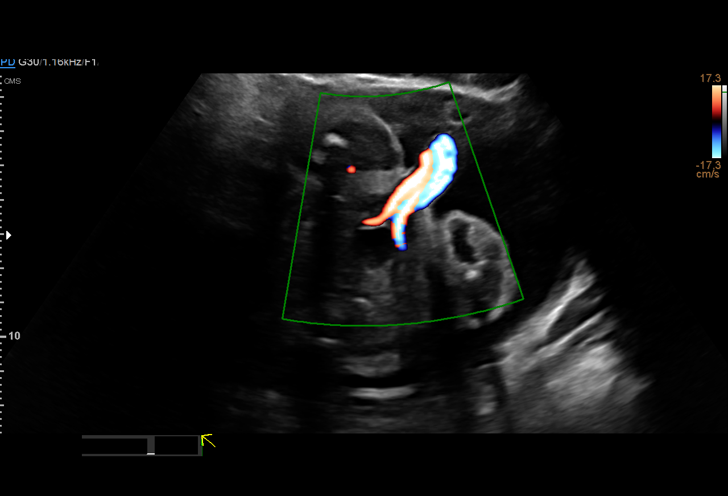
[im 58/87]
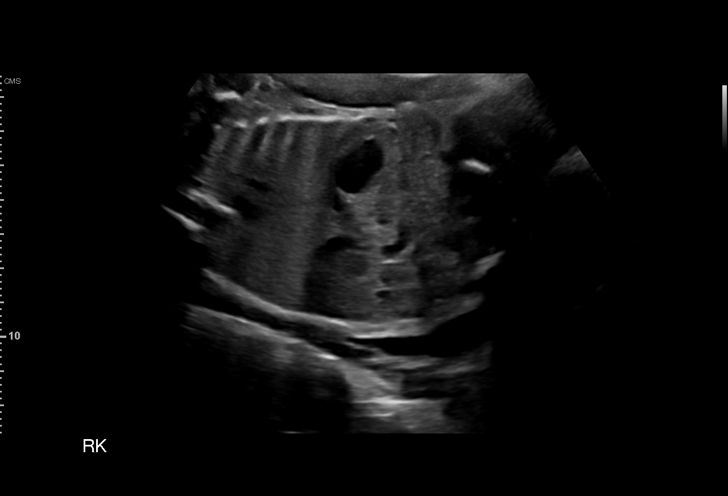
[im 64/87]
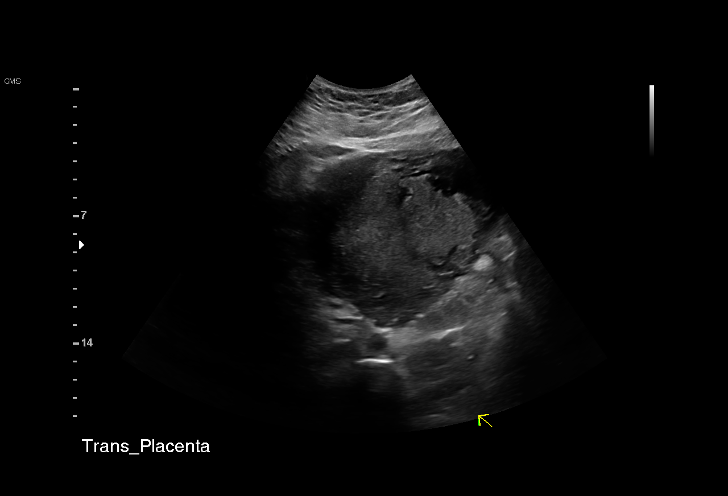
[im 71/87]
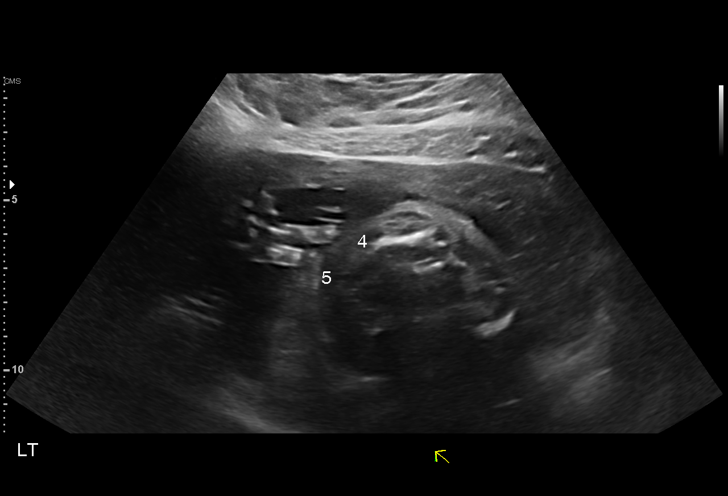
[im 77/87]
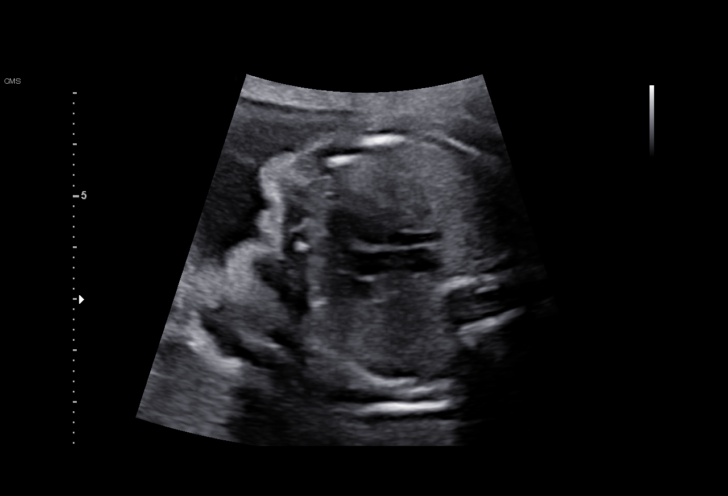
[im 83/87]
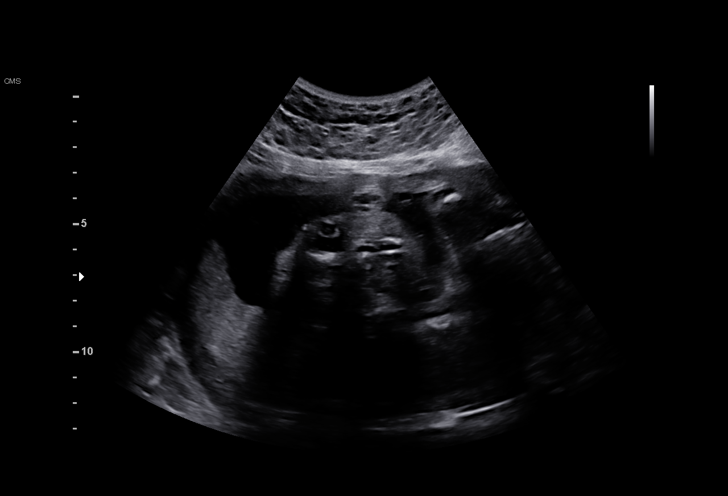

[13 of 28 positions shown; findings below may reference images not displayed]

Indications

 Antenatal follow-up for nonvisualized fetal
 anatomy
 Obesity complicating pregnancy, second
 trimester (BMI 31)
 Genetic carrier (Isovoleric Acidemia)
 Other mental disorder complicating
 pregnancy, second trimester
 Asthma                                         FCC.0C j80.494
 LR NIPS
 25 weeks gestation of pregnancy
Fetal Evaluation

 Num Of Fetuses:         1
 Fetal Heart Rate(bpm):  140
 Cardiac Activity:       Observed
 Presentation:           Breech
 Placenta:               Posterior
 P. Cord Insertion:      Previously Visualized

 Amniotic Fluid
 AFI FV:      Within normal limits

                             Largest Pocket(cm)

Biometry
 BPD:      59.3  mm     G. Age:  24w 2d         11  %    CI:        73.42   %    70 - 86
                                                         FL/HC:      20.2   %    18.7 -
 HC:      219.9  mm     G. Age:  24w 0d        3.1  %    HC/AC:      1.05        1.04 -
 AC:      209.2  mm     G. Age:  25w 3d         47  %    FL/BPD:     75.0   %    71 - 87
 FL:       44.5  mm     G. Age:  24w 5d         19  %    FL/AC:      21.3   %    20 - 24
 HUM:      41.1  mm     G. Age:  25w 0d         33  %
 LV:        2.2  mm

 Est. FW:     755  gm    1 lb 11 oz      27  %
OB History

 Blood Type:   O+
 Gravidity:    1         Term:   0        Prem:   0        SAB:   0
 TOP:          0       Ectopic:  0        Living: 0
Gestational Age

 LMP:           25w 4d        Date:  03/24/20                 EDD:   12/29/20
 U/S Today:     24w 4d                                        EDD:   01/05/21
 Best:          25w 2d     Det. By:  U/S C R L  (06/08/20)    EDD:   12/31/20
Anatomy

 Cranium:               Appears normal         LVOT:                   Appears normal
 Cavum:                 Appears normal         Aortic Arch:            Appears normal
 Ventricles:            Appears normal         Ductal Arch:            Appears normal
 Choroid Plexus:        Previously seen        Diaphragm:              Appears normal
 Cerebellum:            Previously seen        Stomach:                Appears normal, left
                                                                       sided
 Posterior Fossa:       Previously seen        Abdomen:                Appears normal
 Nuchal Fold:           Previously seen        Abdominal Wall:         Appears nml (cord
                                                                       insert, abd wall)
 Face:                  Appears normal         Cord Vessels:           Appears normal (3
                        (orbits and profile)                           vessel cord)
 Lips:                  Appears normal         Kidneys:                Appear normal
 Palate:                Appears normal         Bladder:                Appears normal
 Thoracic:              Appears normal         Spine:                  Appears normal
 Heart:                 Appears normal         Upper Extremities:      Previously seen
                        (4CH, axis, and
                        situs)
 RVOT:                  Appears normal         Lower Extremities:      Previously seen

 Other:  Female gender previously seen. Nasal bone visualized. Heels
         previously seen. Hands/5Ellard visualized.
Cervix Uterus Adnexa

 Cervix
 Not visualized (advanced GA >31wks)

 Uterus
 No abnormality visualized.

 Right Ovary
 Not visualized.
 Left Ovary
 Within normal limits.

 Cul De Sac
 No free fluid seen.

 Adnexa
 No abnormality visualized.
Comments

 This patient was seen for a follow up exam as the views of
 the fetal anatomy were unable to be fully visualized during
 her last exam.  She denies any problems since her last exam.
 She was informed that the fetal growth and amniotic fluid
 level appears appropriate for her gestational age.
 The views of the fetal anatomy were visualized today.  There
 were no obvious anomalies noted.
 The limitations of ultrasound in the detection of all anomalies
 was discussed.
 Follow-up as indicated.

## 2021-10-07 IMAGING — US US PELVIS COMPLETE WITH TRANSVAGINAL
1 series · 14 of 25 positions shown · non-contrast
Comparison: None

CLINICAL DATA: 20-year-old female with possible abnormal IUD
placement.

EXAM:
TRANSABDOMINAL AND TRANSVAGINAL ULTRASOUND OF PELVIS
TECHNIQUE: Both transabdominal and transvaginal ultrasound examinations of the
pelvis were performed. Transabdominal technique was performed for
global imaging of the pelvis including uterus, ovaries, adnexal
regions, and pelvic cul-de-sac. It was necessary to proceed with
endovaginal exam following the transabdominal exam to visualize the
ovaries and endometrium.

[Series 1: us pelvic complete with transvaginal · 76 acquisitions, 14 frames shown]
[im 1/76]
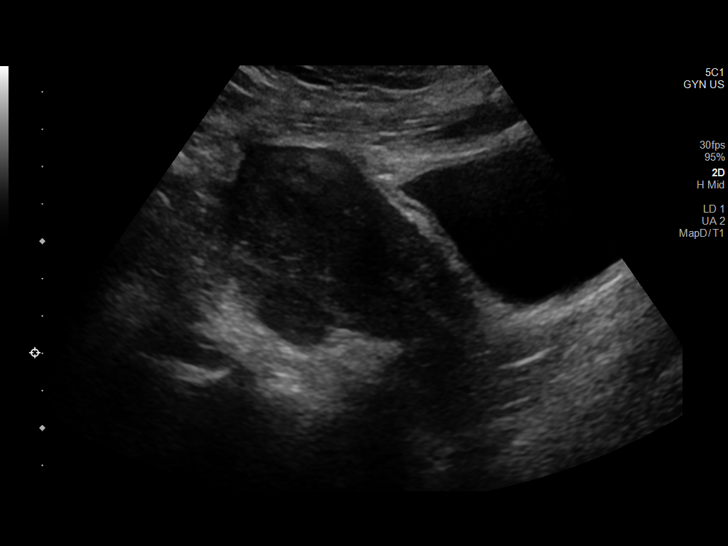
[im 7/76]
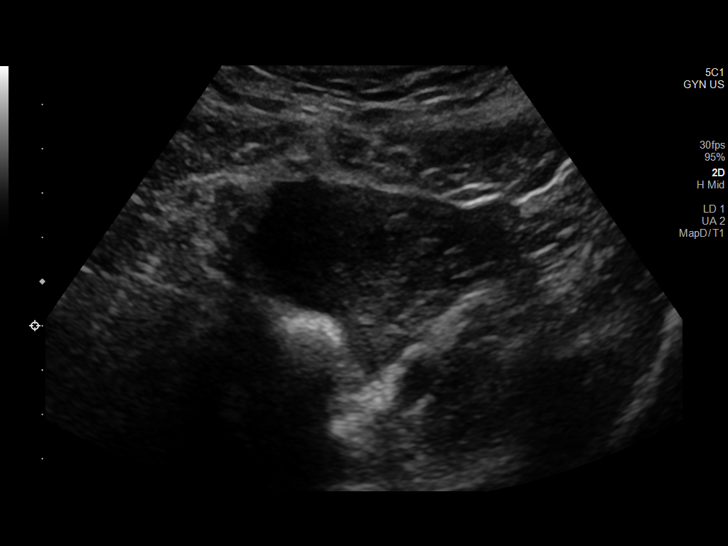
[im 13/76]
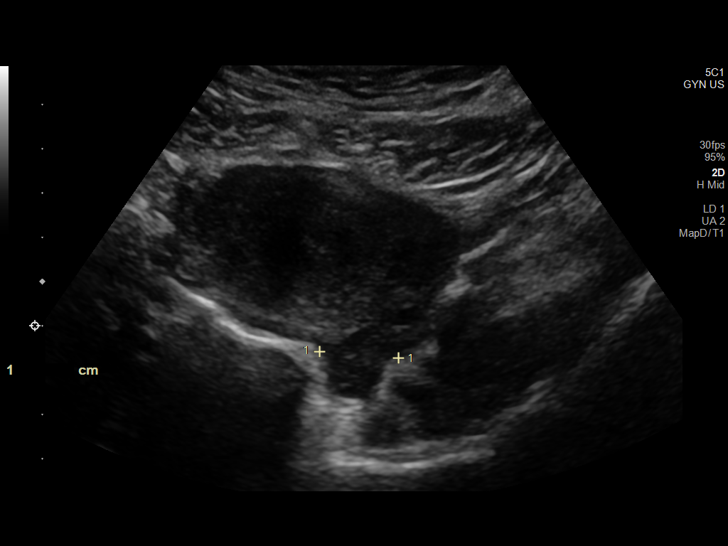
[im 19/76]
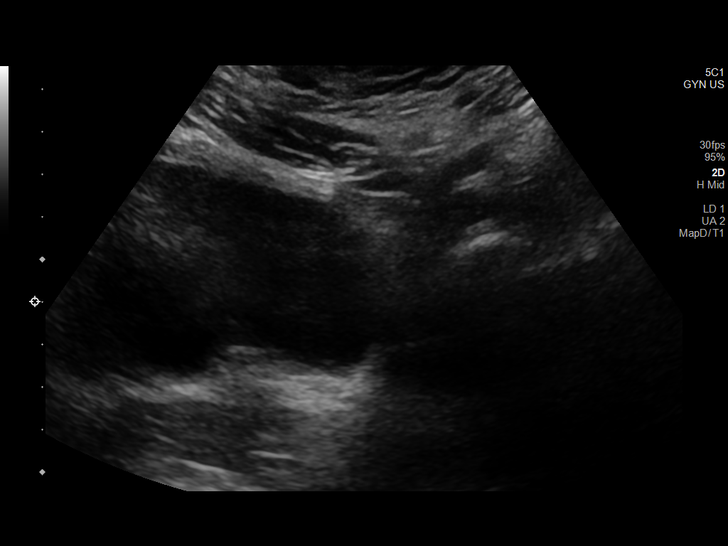
[im 26/76]
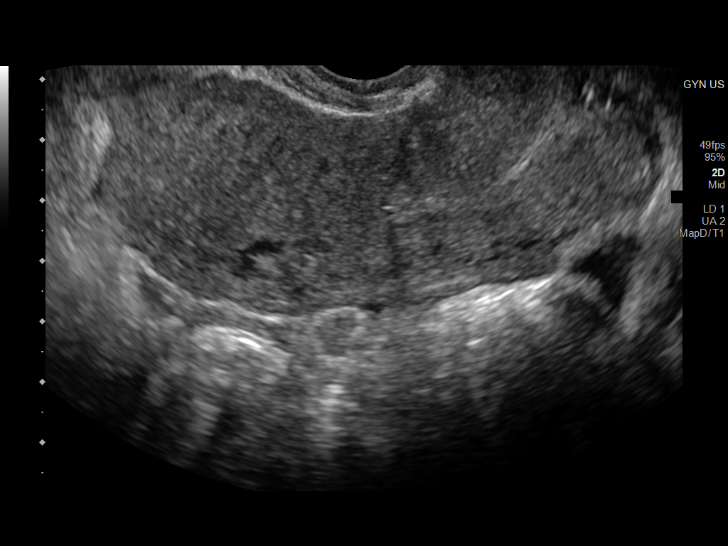
[im 29/76]
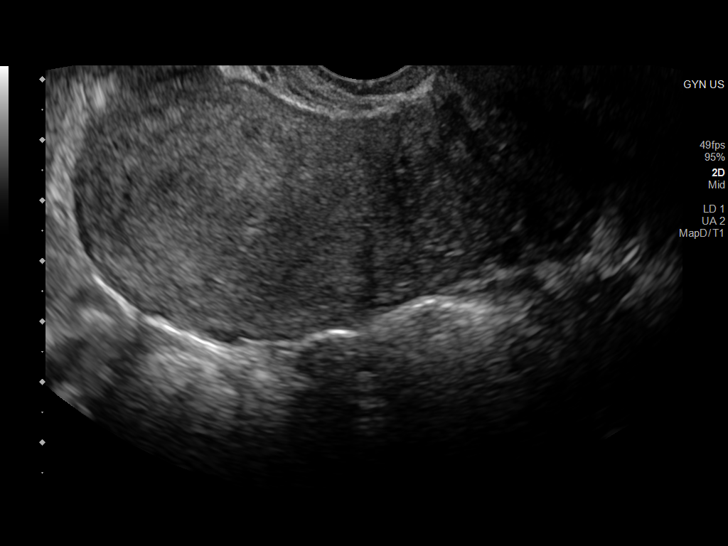
[im 35/76]
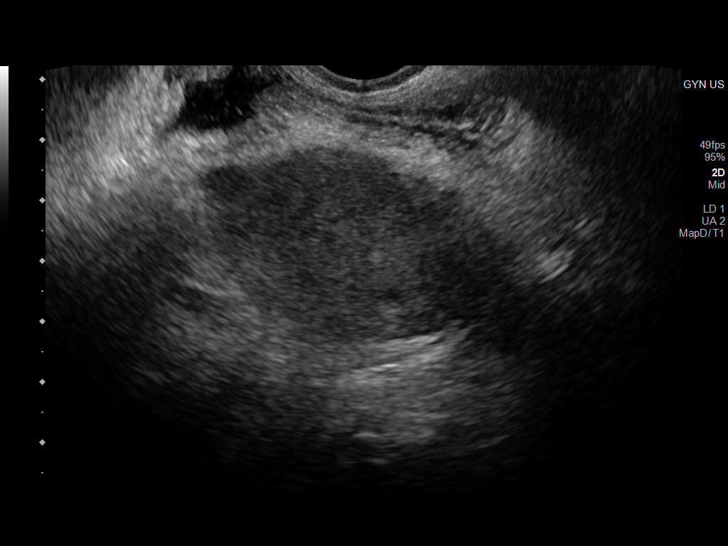
[im 41/76]
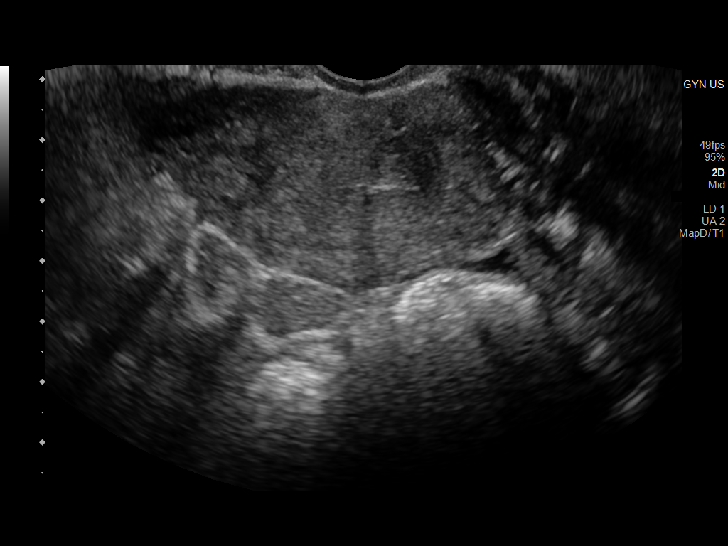
[im 47/76]
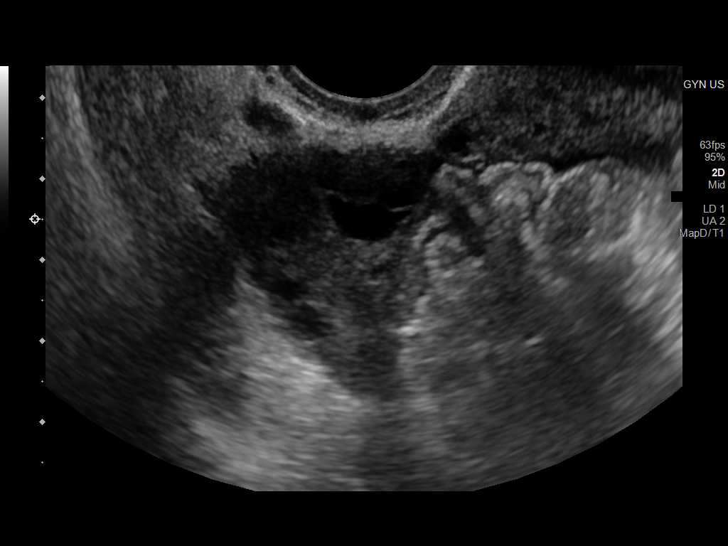
[im 51/76]
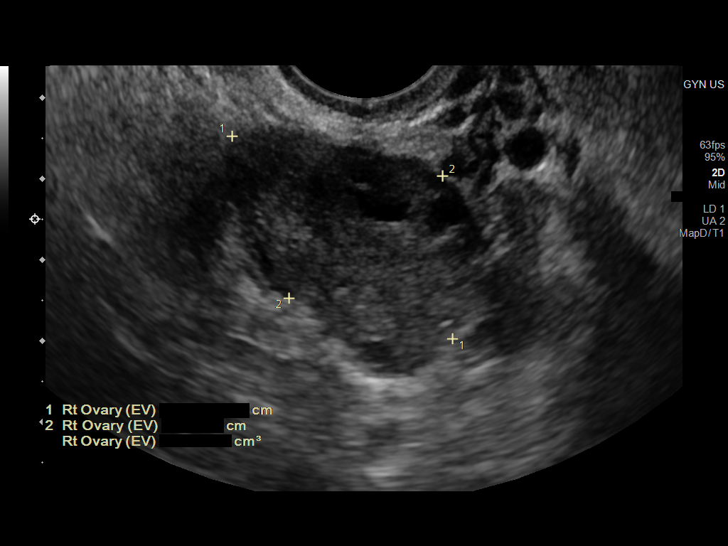
[im 57/76]
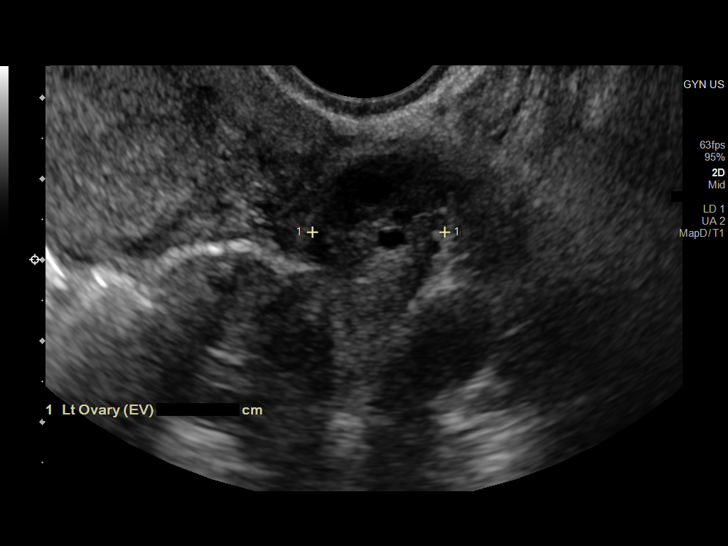
[im 63/76]
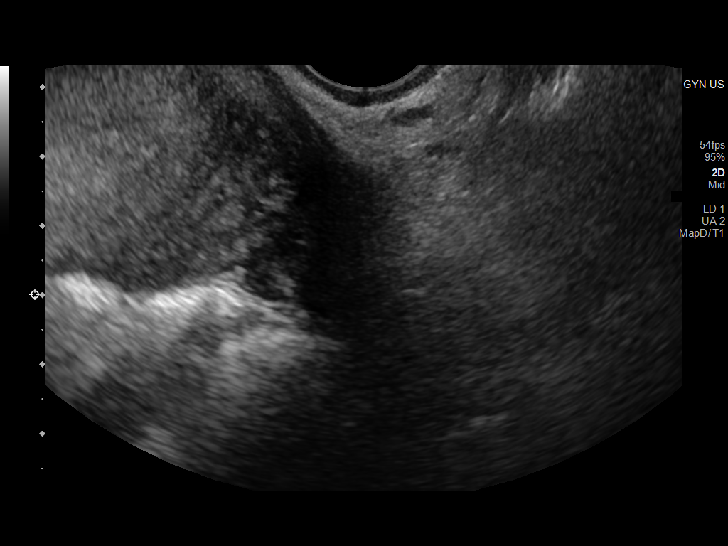
[im 69/76]
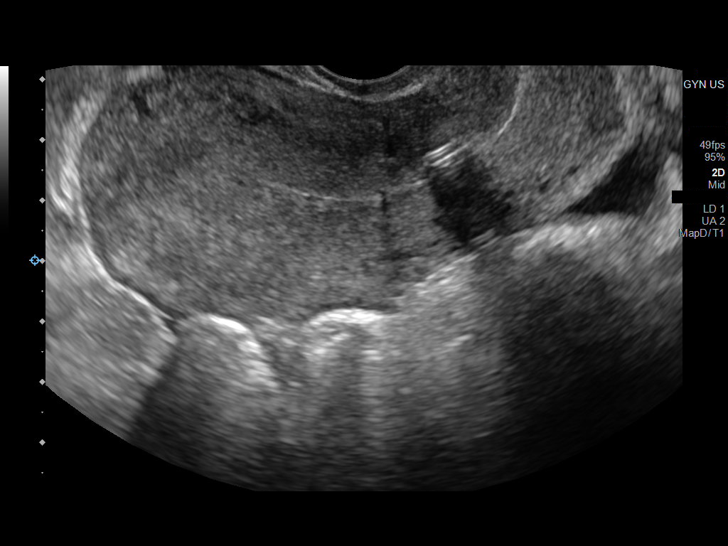
[im 76/76]
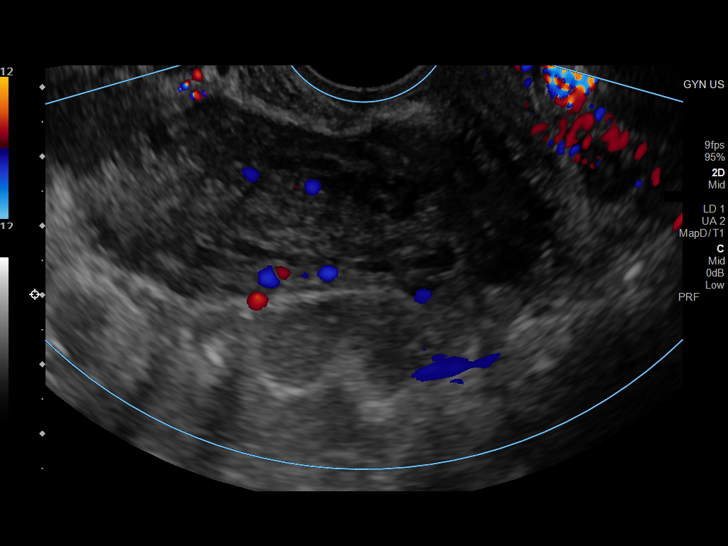

[14 of 25 positions shown; findings below may reference images not displayed]

FINDINGS: Uterus

Measurements: 9.1 x 4.1 x 6 cm = volume: 117 mL. No fibroids or
other mass visualized.

Endometrium

Thickness: 2 mm. An IUD is noted within the LOWER uterine segment.
No focal abnormality visualized.

Right ovary

Measurements: 3.7 x 2.4 x 2.6 cm = volume: 12 mL. Normal
appearance/no adnexal mass.

Left ovary

Measurements: 3.1 x 2.3 x 1.6 cm = volume: 6.2 mL. Normal
appearance/no adnexal mass.

Other findings

Trace free fluid noted.
IMPRESSION: 1. Abnormal IUD position in the LOWER uterine segment.
2. No other significant abnormalities.

## 2021-10-23 ENCOUNTER — Encounter: Payer: Medicaid Other | Admitting: Obstetrics and Gynecology

## 2022-03-12 ENCOUNTER — Emergency Department (HOSPITAL_COMMUNITY)
Admission: EM | Admit: 2022-03-12 | Discharge: 2022-03-12 | Disposition: A | Discharge status: Home / Self Care | Payer: Medicaid Other | Attending: Emergency Medicine | Admitting: Emergency Medicine

## 2022-03-12 ENCOUNTER — Other Ambulatory Visit: Payer: Self-pay

## 2022-03-12 ENCOUNTER — Encounter (HOSPITAL_COMMUNITY): Payer: Self-pay

## 2022-03-12 DIAGNOSIS — R6884 Jaw pain: Secondary | ICD-10-CM | POA: Diagnosis not present

## 2022-03-12 DIAGNOSIS — R519 Headache, unspecified: Secondary | ICD-10-CM | POA: Diagnosis not present

## 2022-03-12 DIAGNOSIS — M542 Cervicalgia: Secondary | ICD-10-CM | POA: Diagnosis not present

## 2022-03-12 DIAGNOSIS — Y92410 Unspecified street and highway as the place of occurrence of the external cause: Secondary | ICD-10-CM | POA: Diagnosis not present

## 2022-03-12 DIAGNOSIS — S0990XA Unspecified injury of head, initial encounter: Secondary | ICD-10-CM | POA: Diagnosis not present

## 2022-03-12 LAB — I-STAT BETA HCG BLOOD, ED (MC, WL, AP ONLY): I-stat hCG, quantitative: <5 m[IU]/mL (ref <5)

## 2022-03-12 MED ORDER — IBUPROFEN 400 MG PO TABS
400.0000 mg | ORAL_TABLET | Freq: Once | ORAL | Status: AC
  Administered 2022-03-12: 400 mg via ORAL
  Filled 2022-03-12: qty 1

## 2022-03-12 MED ORDER — ACETAMINOPHEN 325 MG PO TABS
650.0000 mg | ORAL_TABLET | Freq: Once | ORAL | Status: AC
  Administered 2022-03-12: 650 mg via ORAL
  Filled 2022-03-12: qty 2

## 2022-03-12 MED ORDER — CYCLOBENZAPRINE HCL 10 MG PO TABS
5.0000 mg | ORAL_TABLET | Freq: Once | ORAL | Status: AC
  Administered 2022-03-12: 5 mg via ORAL
  Filled 2022-03-12: qty 1

## 2022-03-12 MED ORDER — LIDOCAINE 5 % EX PTCH
1.0000 | MEDICATED_PATCH | CUTANEOUS | Status: DC
  Administered 2022-03-12: 1 via TRANSDERMAL
  Filled 2022-03-12: qty 1
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
## 2022-04-11 ENCOUNTER — Ambulatory Visit: Payer: Medicaid Other

## 2022-04-25 ENCOUNTER — Ambulatory Visit (HOSPITAL_COMMUNITY)
Admission: EM | Admit: 2022-04-25 | Discharge: 2022-04-25 | Disposition: A | Discharge status: Home / Self Care | Payer: Medicaid Other | Attending: Family Medicine | Admitting: Family Medicine

## 2022-04-25 ENCOUNTER — Encounter (HOSPITAL_COMMUNITY): Payer: Self-pay

## 2022-04-25 DIAGNOSIS — N898 Other specified noninflammatory disorders of vagina: Secondary | ICD-10-CM | POA: Diagnosis not present

## 2022-04-25 MED ORDER — CEFTRIAXONE SODIUM 500 MG IJ SOLR
INTRAMUSCULAR | Status: AC
  Filled 2022-04-25: qty 500

## 2022-04-25 MED ORDER — DOXYCYCLINE HYCLATE 100 MG PO CAPS: 100.0000 mg | ORAL_CAPSULE | Freq: Two times a day (BID) | ORAL | 0 refills | Status: DC

## 2022-04-25 MED ORDER — CEFTRIAXONE SODIUM 500 MG IJ SOLR
500.0000 mg | Freq: Once | INTRAMUSCULAR | Status: AC
  Administered 2022-04-25: 500 mg via INTRAMUSCULAR

## 2022-04-25 NOTE — Discharge Instructions (Signed)

## 2022-04-25 NOTE — ED Triage Notes (Signed)
Pt is here for vaginal discharge and itching x 2wks

## 2022-04-25 NOTE — ED Provider Notes (Signed)
[place]   [id number] 04/25/22 Arrival Time: 2694  ASSESSMENT & PLAN:  1. Vaginal discharge       Discharge Instructions      You have been given the following today for treatment of suspected gonorrhea and/or chlamydia:  cefTRIAXone (ROCEPHIN) injection 500 mg  Please pick up your prescription for doxycycline 100 mg and begin taking twice daily for the next seven (7) days.  Even though we have treated you today, we have sent testing for sexually transmitted infections. We will notify you of any positive results once they are received. If required, we will prescribe any medications you might need.  Please refrain from all sexual activity for at least the next seven days.     Without s/s of PID.  Labs Reviewed  CERVICOVAGINAL ANCILLARY ONLY    Will notify of any positive results. Instructed to refrain from sexual activity for at least seven days.  Reviewed expectations re: course of current medical issues. Questions answered. Outlined signs and symptoms indicating need for more acute intervention. Patient verbalized understanding. After Visit Summary given.   SUBJECTIVE:  Natalie [name] is a 21 y.o. female who presents with complaint of vaginal discharge. Onset gradual. First noticed  1-2 w ago . Poorly described. No specific aggravating or alleviating factors reported. Denies: urinary frequency, dysuria, and gross hematuria. Afebrile. No abdominal or pelvic pain. Normal PO intake wihout n/v. No genital rashes or lesions. Reports that she is sexually active and does have some worries regarding possibility of STD.  No LMP recorded (lmp unknown). (Menstrual status: IUD).   OBJECTIVE:  Vitals:   04/25/22 1554 04/25/22 1555  BP:  101/68  Pulse:  89  Resp:  12  Temp:  98 F (36.7 C)  TempSrc:  Oral  SpO2:  99%  Weight: 63.5 kg   Height: 5\' 1"  (1.549 m)      General appearance: alert, cooperative, appears stated age and no distress Lungs:  unlabored respirations; speaks full sentences without difficulty Back: no CVA tenderness; FROM at waist Abdomen: soft, non-tender GU: deferred Skin: warm and dry Psychological: alert and cooperative; normal mood and affect.   Labs Reviewed  CERVICOVAGINAL ANCILLARY ONLY    Allergies  Allergen Reactions   Pollen Extract Other (See Comments)    Seasonal allergies     Past Medical History:  Diagnosis Date   Allergy    Anemia affecting pregnancy in third trimester 10/04/2020   Asthma    Cesarean delivery delivered 12/10/2020   Breech presentation   Sciatica of right side 11/07/2020   Tonsillitis    Trichomoniasis    Family History  Problem Relation Age of Onset   Learning disabilities Mother    Mental illness Mother        Depression   Diabetes Paternal Aunt    Asthma Maternal Grandmother    Hypertension Maternal Grandmother    Learning disabilities Maternal Grandmother    Mental illness Maternal Grandmother        Depression   Stroke Maternal Grandmother    Hypertension Maternal Grandfather    Alcohol abuse Maternal Grandfather    Drug abuse Maternal Grandfather    Social History   Socioeconomic History   Marital status: Single    Spouse name: Not on file   Number of children: Not on file   Years of education: Not on file   Highest education level: Not on file  Occupational History   Not on file  Tobacco Use   Smoking status:  Former    Types: Cigars   Smokeless tobacco: Never   Tobacco comments:    last used 03-24-20  Vaping Use   Vaping Use: Every day   Substances: Nicotine  Substance and Sexual Activity   Alcohol use: Not Currently    Comment: last used 03-23-20   Drug use: Not Currently    Types: Marijuana    Comment: last used 03-23-20   Sexual activity: Yes  Other Topics Concern   Not on file  Social History Narrative   Lives with mother, her boyfriend and an 40 month old brother.  She was living in [place] with her father until last year.  (Mom moved  here in 2013).  Father has since moved to [facility] so she is able to see him.   Social Determinants of Health   Financial Resource Strain: Not on file  Food Insecurity: No Food Insecurity (01/31/2021)   Hunger Vital Sign    Worried About Running Out of Food in the Last Year: Never true    Ran Out of Food in the Last Year: Never true  Transportation Needs: Unmet Transportation Needs (01/31/2021)   PRAPARE - Hydrologist (Medical): Yes    Lack of Transportation (Non-Medical): Yes  Physical Activity: Not on file  Stress: Not on file  Social Connections: Not on file  Intimate Partner Violence: Not on file           [place], MD 04/25/22 [phone]

## 2022-04-26 ENCOUNTER — Telehealth (HOSPITAL_COMMUNITY): Payer: Self-pay | Admitting: Emergency Medicine

## 2022-04-26 LAB — CERVICOVAGINAL ANCILLARY ONLY
Bacterial Vaginitis (gardnerella): POSITIVE — AB
Candida Glabrata: NEGATIVE
Candida Vaginitis: NEGATIVE
Chlamydia: POSITIVE — AB
Comment: NEGATIVE
Comment: NEGATIVE
Comment: NEGATIVE
Comment: NEGATIVE
Comment: NEGATIVE
Comment: NORMAL
Neisseria Gonorrhea: POSITIVE — AB
Trichomonas: NEGATIVE

## 2022-04-26 MED ORDER — METRONIDAZOLE 0.75 % VA GEL: 1.0000 | Freq: Every day | VAGINAL | 0 refills | Status: AC

## 2022-06-02 ENCOUNTER — Ambulatory Visit
Admission: EM | Admit: 2022-06-02 | Discharge: 2022-06-02 | Disposition: A | Discharge status: Home / Self Care | Payer: Medicaid Other | Attending: Internal Medicine | Admitting: Internal Medicine

## 2022-06-02 ENCOUNTER — Encounter: Payer: Self-pay | Admitting: Emergency Medicine

## 2022-06-02 DIAGNOSIS — N3001 Acute cystitis with hematuria: Secondary | ICD-10-CM | POA: Insufficient documentation

## 2022-06-02 DIAGNOSIS — N898 Other specified noninflammatory disorders of vagina: Secondary | ICD-10-CM | POA: Insufficient documentation

## 2022-06-02 DIAGNOSIS — Z113 Encounter for screening for infections with a predominantly sexual mode of transmission: Secondary | ICD-10-CM | POA: Insufficient documentation

## 2022-06-02 LAB — POCT URINALYSIS DIP (MANUAL ENTRY)
Bilirubin, UA: NEGATIVE
Glucose, UA: NEGATIVE mg/dL
Ketones, POC UA: NEGATIVE mg/dL
Nitrite, UA: NEGATIVE
Protein Ur, POC: NEGATIVE mg/dL
Spec Grav, UA: 1.02 (ref 1.010–1.025)
Urobilinogen, UA: 0.2 E.U./dL
pH, UA: 6 (ref 5.0–8.0)

## 2022-06-02 LAB — POCT URINE PREGNANCY: Preg Test, Ur: NEGATIVE

## 2022-06-02 MED ORDER — CEPHALEXIN 250 MG PO CAPS: 250.0000 mg | ORAL_CAPSULE | Freq: Four times a day (QID) | ORAL | 0 refills | Status: AC

## 2022-06-02 NOTE — ED Provider Notes (Signed)
[facility]    CSN: [id number] Arrival date & time: 06/02/22  1356      History   Chief Complaint Chief Complaint  Patient presents with   Vaginal Itching   Dysuria   Vaginal Discharge    HPI Natalie [name] is a 21 y.o. female.   Patient presents with vaginal discharge, vaginal irritation, urinary burning, urinary frequency that started about 2 days ago.  Vaginal discharge is white in color.  She reports symptoms started after having unprotected sexual intercourse but denies any confirmed exposure to STD.  Denies abnormal vaginal bleeding, hematuria, abdominal pain, back pain, fever.  Last menstrual cycle is unknown as she has an IUD in place and does not have regular menstrual cycles.   Vaginal Itching  Dysuria Vaginal Discharge Associated symptoms: vaginal itching     Past Medical History:  Diagnosis Date   Allergy    Anemia affecting pregnancy in third trimester 10/04/2020   Asthma    Cesarean delivery delivered 12/10/2020   Breech presentation   Sciatica of right side 11/07/2020   Tonsillitis    Trichomoniasis     Patient Active Problem List   Diagnosis Date Noted   Depression 03/02/2021   IUD (intrauterine device) in place 12/10/2020   Hidradenitis suppurativa 10/08/2019   Dysmenorrhea 05/02/2015   Asthma, mild intermittent, well-controlled 10/15/2013   Allergic rhinitis 10/15/2013    Past Surgical History:  Procedure Laterality Date   CESAREAN SECTION N/A 12/10/2020   Procedure: CESAREAN SECTION;  Surgeon: Janyth Pupa, DO;  Location: [facility];  Service: Obstetrics;  Laterality: N/A;   HYDRADENITIS EXCISION Bilateral 12/08/2019   Procedure: WIDE EXCISION BILATERAL AXILLARY HIDRADENITIS;  Surgeon: Coralie Keens, MD;  Location: [place];  Service: General;  Laterality: Bilateral;    OB History     Gravida  1   Para      Term      Preterm      AB      Living         SAB      IAB      Ectopic      Multiple      Live  Births               Home Medications    Prior to Admission medications   Medication Sig Start Date End Date Taking? Authorizing Provider  cephALEXin (KEFLEX) 250 MG capsule Take 1 capsule (250 mg total) by mouth 4 (four) times daily for 5 days. 06/02/22 06/07/22 Yes Latosha Gaylord, Michele Rockers, FNP  doxycycline (VIBRAMYCIN) 100 MG capsule Take 1 capsule (100 mg total) by mouth 2 (two) times daily. 04/25/22   Vanessa Kick, MD  sertraline (ZOLOFT) 100 MG tablet Take 1 tablet (100 mg total) by mouth daily. 04/12/21   Clarnce Flock, MD    Family History Family History  Problem Relation Age of Onset   Learning disabilities Mother    Mental illness Mother        Depression   Diabetes Paternal Aunt    Asthma Maternal Grandmother    Hypertension Maternal Grandmother    Learning disabilities Maternal Grandmother    Mental illness Maternal Grandmother        Depression   Stroke Maternal Grandmother    Hypertension Maternal Grandfather    Alcohol abuse Maternal Grandfather    Drug abuse Maternal Grandfather     Social History Social History   Tobacco Use   Smoking status: Former  Types: Cigars   Smokeless tobacco: Never   Tobacco comments:    last used 03-24-20  Vaping Use   Vaping Use: Every day   Substances: Nicotine  Substance Use Topics   Alcohol use: Not Currently    Comment: last used 03-23-20   Drug use: Not Currently    Types: Marijuana    Comment: last used 03-23-20     Allergies   Pollen extract   Review of Systems Review of Systems Per HPI  Physical Exam Triage Vital Signs ED Triage Vitals [06/02/22 1445]  Enc Vitals Group     BP 112/73     Pulse Rate 82     Resp 18     Temp 97.8 F (36.6 C)     Temp src      SpO2 99 %     Weight      Height      Head Circumference      Peak Flow      Pain Score 0     Pain Loc      Pain Edu?      Excl. in [place]?    No data found.  Updated Vital Signs BP 112/73   Pulse 82   Temp 97.8 F (36.6 C)   Resp 18    SpO2 99%   Visual Acuity Right Eye Distance:   Left Eye Distance:   Bilateral Distance:    Right Eye Near:   Left Eye Near:    Bilateral Near:     Physical Exam Constitutional:      General: She is not in acute distress.    Appearance: Normal appearance. She is not toxic-appearing or diaphoretic.  HENT:     Head: Normocephalic and atraumatic.  Eyes:     Extraocular Movements: Extraocular movements intact.     Conjunctiva/sclera: Conjunctivae normal.  Cardiovascular:     Rate and Rhythm: Normal rate and regular rhythm.     Pulses: Normal pulses.     Heart sounds: Normal heart sounds.  Pulmonary:     Effort: Pulmonary effort is normal. No respiratory distress.     Breath sounds: Normal breath sounds.  Abdominal:     General: Bowel sounds are normal. There is no distension.     Palpations: Abdomen is soft.     Tenderness: There is no abdominal tenderness.  Genitourinary:    Comments: Deferred with shared decision making.  Self swab performed. Neurological:     General: No focal deficit present.     Mental Status: She is alert and oriented to person, place, and time. Mental status is at baseline.  Psychiatric:        Mood and Affect: Mood normal.        Behavior: Behavior normal.        Thought Content: Thought content normal.        Judgment: Judgment normal.      UC Treatments / Results  Labs (all labs ordered are listed, but only abnormal results are displayed) Labs Reviewed  POCT URINALYSIS DIP (MANUAL ENTRY) - Abnormal; Notable for the following components:      Result Value   Blood, UA moderate (*)    Leukocytes, UA Small (1+) (*)    All other components within normal limits  URINE CULTURE  POCT URINE PREGNANCY  CERVICOVAGINAL ANCILLARY ONLY    EKG   Radiology No results found.  Procedures Procedures (including critical care time)  Medications Ordered in UC Medications - No data  to display  Initial Impression / Assessment and Plan / UC Course   I have reviewed the triage vital signs and the nursing notes.  Pertinent labs & imaging results that were available during my care of the patient were reviewed by me and considered in my medical decision making (see chart for details).     *** Final Clinical Impressions(s) / UC Diagnoses   Final diagnoses:  Acute cystitis with hematuria  Vaginal discharge  Screening examination for venereal disease     Discharge Instructions      I am treating you for UTI with antibiotic.  Your vaginal swab and urine culture pending.  We will call if they are abnormal.  Please follow-up if symptoms persist or worsen.  Refrain from sexual activity until test results and treatment are complete.   ED Prescriptions     Medication Sig Dispense Auth. Provider   cephALEXin (KEFLEX) 250 MG capsule Take 1 capsule (250 mg total) by mouth 4 (four) times daily for 5 days. 20 capsule Teodora Medici, [place]      PDMP not reviewed this encounter.

## 2022-06-02 NOTE — ED Triage Notes (Signed)
Pt is present today with c/o vaginal discharge, vaginal irritation, and dysuria x2 days

## 2022-06-02 NOTE — Discharge Instructions (Signed)
I am treating you for UTI with antibiotic.  Your vaginal swab and urine culture pending.  We will call if they are abnormal.  Please follow-up if symptoms persist or worsen.  Refrain from sexual activity until test results and treatment are complete.

## 2022-06-04 LAB — CERVICOVAGINAL ANCILLARY ONLY
Bacterial Vaginitis (gardnerella): NEGATIVE
Candida Glabrata: NEGATIVE
Candida Vaginitis: NEGATIVE
Chlamydia: POSITIVE — AB
Comment: NEGATIVE
Comment: NEGATIVE
Comment: NEGATIVE
Comment: NEGATIVE
Comment: NEGATIVE
Comment: NORMAL
Neisseria Gonorrhea: POSITIVE — AB
Trichomonas: NEGATIVE

## 2022-06-04 LAB — URINE CULTURE: Culture: 30000 — AB

## 2022-06-05 ENCOUNTER — Telehealth (HOSPITAL_COMMUNITY): Payer: Self-pay | Admitting: Emergency Medicine

## 2022-06-05 ENCOUNTER — Ambulatory Visit
Admission: EM | Admit: 2022-06-05 | Discharge: 2022-06-05 | Disposition: A | Discharge status: Home / Self Care | Payer: Medicaid Other | Attending: Internal Medicine | Admitting: Internal Medicine

## 2022-06-05 DIAGNOSIS — Z113 Encounter for screening for infections with a predominantly sexual mode of transmission: Secondary | ICD-10-CM | POA: Diagnosis not present

## 2022-06-05 MED ORDER — DOXYCYCLINE HYCLATE 100 MG PO CAPS: 100.0000 mg | ORAL_CAPSULE | Freq: Two times a day (BID) | ORAL | 0 refills | Status: AC

## 2022-06-05 MED ORDER — CEFTRIAXONE SODIUM 500 MG IJ SOLR: 500.0000 mg | Freq: Once | INTRAMUSCULAR | Status: DC

## 2022-06-05 NOTE — Telephone Encounter (Signed)
Per protocol, patient to d/c Keflex.  Will need treatment with IM Rocephin 500mg  for positive Gonorrhea.  Will also need treatment with Doxycycline.  Attempted to reach patient x 1, unable to LVM Prescription sent to pharmacy on file [facility] notified Results seen on mychart

## 2022-06-05 NOTE — ED Triage Notes (Signed)
Pt presents for sti treatment

## 2022-06-28 ENCOUNTER — Ambulatory Visit (HOSPITAL_COMMUNITY)
Admission: EM | Admit: 2022-06-28 | Discharge: 2022-06-28 | Disposition: A | Discharge status: Home / Self Care | Payer: Medicaid Other | Attending: Internal Medicine | Admitting: Internal Medicine

## 2022-06-28 ENCOUNTER — Encounter (HOSPITAL_COMMUNITY): Payer: Self-pay | Admitting: Emergency Medicine

## 2022-06-28 DIAGNOSIS — N898 Other specified noninflammatory disorders of vagina: Secondary | ICD-10-CM | POA: Diagnosis not present

## 2022-06-28 LAB — HIV ANTIBODY (ROUTINE TESTING W REFLEX): HIV Screen 4th Generation wRfx: NONREACTIVE

## 2022-06-28 NOTE — Discharge Instructions (Addendum)
Please abstain from sexual intercourse until lab results are available Will call you with recommendations if labs are abnormal Return to urgent care if you have any other concerns.

## 2022-06-28 NOTE — ED Provider Notes (Signed)
[facility]    CSN: [id number] Arrival date & time: 06/28/22  1138      History   Chief Complaint Chief Complaint  Patient presents with   Exposure to STD    HPI Natalie [name] is a 21 y.o. female comes to the urgent care for vaginal discharge of 3 days duration.  Patient was recently treated for chlamydia and gonorrhea.  After treatment patient has engaged in unprotected sexual intercourse with the same partner.  She is not sure if the partner was treated or not.  She denies any dysuria, urgency or frequency.  No deep or superficial dyspareunia.  No vaginal rash.  Patient would like evaluation for vaginal discharge.  Vaginal discharge is cloudy, thin and associated with vaginal itching.  No abdominal pain.   HPI  Past Medical History:  Diagnosis Date   Allergy    Anemia affecting pregnancy in third trimester 10/04/2020   Asthma    Cesarean delivery delivered 12/10/2020   Breech presentation   Sciatica of right side 11/07/2020   Tonsillitis    Trichomoniasis     Patient Active Problem List   Diagnosis Date Noted   Depression 03/02/2021   IUD (intrauterine device) in place 12/10/2020   Hidradenitis suppurativa 10/08/2019   Dysmenorrhea 05/02/2015   Asthma, mild intermittent, well-controlled 10/15/2013   Allergic rhinitis 10/15/2013    Past Surgical History:  Procedure Laterality Date   CESAREAN SECTION N/A 12/10/2020   Procedure: CESAREAN SECTION;  Surgeon: Myna Hidalgo, DO;  Location: [facility];  Service: Obstetrics;  Laterality: N/A;   HYDRADENITIS EXCISION Bilateral 12/08/2019   Procedure: WIDE EXCISION BILATERAL AXILLARY HIDRADENITIS;  Surgeon: Abigail Miyamoto, MD;  Location: [facility];  Service: General;  Laterality: Bilateral;    OB History     Gravida  1   Para      Term      Preterm      AB      Living         SAB      IAB      Ectopic      Multiple      Live Births               Home Medications    Prior to Admission  medications   Medication Sig Start Date End Date Taking? Authorizing Provider  sertraline (ZOLOFT) 100 MG tablet Take 1 tablet (100 mg total) by mouth daily. 04/12/21   Venora Maples, MD    Family History Family History  Problem Relation Age of Onset   Learning disabilities Mother    Mental illness Mother        Depression   Diabetes Paternal Aunt    Asthma Maternal Grandmother    Hypertension Maternal Grandmother    Learning disabilities Maternal Grandmother    Mental illness Maternal Grandmother        Depression   Stroke Maternal Grandmother    Hypertension Maternal Grandfather    Alcohol abuse Maternal Grandfather    Drug abuse Maternal Grandfather     Social History Social History   Tobacco Use   Smoking status: Former    Types: Cigars   Smokeless tobacco: Never   Tobacco comments:    last used 03-24-20  Vaping Use   Vaping Use: Every day   Substances: Nicotine  Substance Use Topics   Alcohol use: Not Currently    Comment: last used 03-23-20   Drug use: Not Currently  Types: Marijuana    Comment: last used 03-23-20     Allergies   Pollen extract   Review of Systems Review of Systems  Respiratory: Negative.    Cardiovascular: Negative.   Gastrointestinal:  Negative for abdominal pain.  Genitourinary: Negative.  Negative for vaginal bleeding, vaginal discharge and vaginal pain.     Physical Exam Triage Vital Signs ED Triage Vitals  Enc Vitals Group     BP 06/28/22 1329 117/74     Pulse Rate 06/28/22 1329 94     Resp --      Temp 06/28/22 1329 98.6 F (37 C)     Temp Source 06/28/22 1329 Oral     SpO2 06/28/22 1328 97 %     Weight --      Height --      Head Circumference --      Peak Flow --      Pain Score 06/28/22 1328 0     Pain Loc --      Pain Edu? --      Excl. in [place]? --    No data found.  Updated Vital Signs BP 117/74 (BP Location: Right Arm)   Pulse 94   Temp 98.6 F (37 C) (Oral)   SpO2 98%   Visual Acuity Right Eye  Distance:   Left Eye Distance:   Bilateral Distance:    Right Eye Near:   Left Eye Near:    Bilateral Near:     Physical Exam   UC Treatments / Results  Labs (all labs ordered are listed, but only abnormal results are displayed) Labs Reviewed  HIV ANTIBODY (ROUTINE TESTING W REFLEX)  RPR  CERVICOVAGINAL ANCILLARY ONLY    EKG   Radiology No results found.  Procedures Procedures (including critical care time)  Medications Ordered in UC Medications - No data to display  Initial Impression / Assessment and Plan / UC Course  I have reviewed the triage vital signs and the nursing notes.  Pertinent labs & imaging results that were available during my care of the patient were reviewed by me and considered in my medical decision making (see chart for details).     1.  Vaginal discharge: This may be STI related. Cervical vaginal swab for GC/chlamydia/trichomonas/vaginal yeast/bacterial vaginosis Will call patient with recommendations if labs are abnormal Return precautions given Sexual abstinence precautions given.. Abstain from sexual intercourse all labs are available Final Clinical Impressions(s) / UC Diagnoses   Final diagnoses:  Vaginal discharge     Discharge Instructions      Please abstain from sexual intercourse until lab results are available Will call you with recommendations if labs are abnormal Return to urgent care if you have any other concerns.   ED Prescriptions   None    PDMP not reviewed this encounter.   Chase Picket, MD 06/28/22 1420

## 2022-06-28 NOTE — ED Triage Notes (Signed)
Pt reports vaginal itching and cloudy discharge x 3 days. States she believes she has Chlamydia. Denies known exposure.

## 2022-06-29 LAB — CERVICOVAGINAL ANCILLARY ONLY
Bacterial Vaginitis (gardnerella): POSITIVE — AB
Candida Glabrata: NEGATIVE
Candida Vaginitis: POSITIVE — AB
Chlamydia: NEGATIVE
Comment: NEGATIVE
Comment: NEGATIVE
Comment: NEGATIVE
Comment: NEGATIVE
Comment: NEGATIVE
Comment: NORMAL
Neisseria Gonorrhea: NEGATIVE
Trichomonas: NEGATIVE

## 2022-06-29 LAB — RPR: RPR Ser Ql: NONREACTIVE

## 2022-07-02 ENCOUNTER — Telehealth (HOSPITAL_COMMUNITY): Payer: Self-pay | Admitting: Emergency Medicine

## 2022-07-02 MED ORDER — FLUCONAZOLE 150 MG PO TABS: 150.0000 mg | ORAL_TABLET | Freq: Once | ORAL | 0 refills | Status: AC

## 2022-07-02 MED ORDER — METRONIDAZOLE 0.75 % VA GEL: 1.0000 | Freq: Every day | VAGINAL | 0 refills | Status: AC

## 2022-10-15 ENCOUNTER — Ambulatory Visit (HOSPITAL_COMMUNITY)
Admission: EM | Admit: 2022-10-15 | Discharge: 2022-10-15 | Disposition: A | Discharge status: Home / Self Care | Payer: Medicaid Other | Attending: Internal Medicine | Admitting: Internal Medicine

## 2022-10-15 ENCOUNTER — Other Ambulatory Visit: Payer: Self-pay

## 2022-10-15 ENCOUNTER — Encounter (HOSPITAL_COMMUNITY): Payer: Self-pay | Admitting: Emergency Medicine

## 2022-10-15 DIAGNOSIS — R300 Dysuria: Secondary | ICD-10-CM | POA: Diagnosis not present

## 2022-10-15 DIAGNOSIS — N898 Other specified noninflammatory disorders of vagina: Secondary | ICD-10-CM | POA: Insufficient documentation

## 2022-10-15 LAB — POCT URINALYSIS DIPSTICK, ED / UC
Bilirubin Urine: NEGATIVE
Glucose, UA: NEGATIVE mg/dL
Hgb urine dipstick: NEGATIVE
Ketones, ur: NEGATIVE mg/dL
Leukocytes,Ua: NEGATIVE
Nitrite: NEGATIVE
Protein, ur: NEGATIVE mg/dL
Specific Gravity, Urine: 1.02 (ref 1.005–1.030)
Urobilinogen, UA: 1 mg/dL (ref 0.0–1.0)
pH: 7 (ref 5.0–8.0)

## 2022-10-15 LAB — POC URINE PREG, ED: Preg Test, Ur: NEGATIVE

## 2022-10-15 NOTE — Discharge Instructions (Signed)
Your urine was normal with no evidence of infection.  We will contact you if your swab is positive.  Please monitor your MyChart for these results.  Wear loosefitting cotton underwear and use hypoallergenic soaps and detergents.  If you develop any additional symptoms including pelvic pain, abdominal pain, fever, nausea, vomiting you should be seen immediately.

## 2022-10-15 NOTE — ED Provider Notes (Signed)
[facility]    CSN: [id number] Arrival date & time: 10/15/22  1339      History   Chief Complaint Chief Complaint  Patient presents with   Dysuria    HPI Natalie [name] is a 22 y.o. female.   Patient presents today with a 2-day history of vaginal irritation and UTI symptoms.  She reports itching and burning in the vagina and with urination.  Denies any frequency, urgency, abdominal pain, fever, nausea, vomiting.  She is sexually active with female partners and does not always use condoms.  She denies any significant vaginal discharge or odor.  Denies any changes to personal hygiene products including soaps or detergents.  She has tried boric acid suppositories she has a history of BV but has not tried any additional over-the-counter medications for symptom management.  She denies any recent antibiotics or medication changes.  Denies history of diabetes or SGLT2 use inhibitor.  Denies any recent urogenital procedure, self-catheterization, nephrolithiasis, single kidney.    Past Medical History:  Diagnosis Date   Allergy    Anemia affecting pregnancy in third trimester 10/04/2020   Asthma    Cesarean delivery delivered 12/10/2020   Breech presentation   Sciatica of right side 11/07/2020   Tonsillitis    Trichomoniasis     Patient Active Problem List   Diagnosis Date Noted   Depression 03/02/2021   IUD (intrauterine device) in place 12/10/2020   Hidradenitis suppurativa 10/08/2019   Dysmenorrhea 05/02/2015   Asthma, mild intermittent, well-controlled 10/15/2013   Allergic rhinitis 10/15/2013    Past Surgical History:  Procedure Laterality Date   CESAREAN SECTION N/A 12/10/2020   Procedure: CESAREAN SECTION;  Surgeon: Janyth Pupa, DO;  Location: [facility];  Service: Obstetrics;  Laterality: N/A;   HYDRADENITIS EXCISION Bilateral 12/08/2019   Procedure: WIDE EXCISION BILATERAL AXILLARY HIDRADENITIS;  Surgeon: Coralie Keens, MD;  Location: [facility];  Service:  General;  Laterality: Bilateral;    OB History     Gravida  1   Para      Term      Preterm      AB      Living         SAB      IAB      Ectopic      Multiple      Live Births               Home Medications    Prior to Admission medications   Medication Sig Start Date End Date Taking? Authorizing Provider  sertraline (ZOLOFT) 100 MG tablet Take 1 tablet (100 mg total) by mouth daily. 04/12/21   Clarnce Flock, MD    Family History Family History  Problem Relation Age of Onset   Learning disabilities Mother    Mental illness Mother        Depression   Diabetes Paternal Aunt    Asthma Maternal Grandmother    Hypertension Maternal Grandmother    Learning disabilities Maternal Grandmother    Mental illness Maternal Grandmother        Depression   Stroke Maternal Grandmother    Hypertension Maternal Grandfather    Alcohol abuse Maternal Grandfather    Drug abuse Maternal Grandfather     Social History Social History   Tobacco Use   Smoking status: Former    Types: Cigars   Smokeless tobacco: Never   Tobacco comments:    last used 03-24-20  Vaping Use   Vaping  Use: Every day   Substances: Nicotine  Substance Use Topics   Alcohol use: Not Currently    Comment: last used 03-23-20   Drug use: Not Currently    Types: Marijuana    Comment: last used 03-23-20     Allergies   Pollen extract   Review of Systems Review of Systems  Constitutional:  Negative for activity change, appetite change, fatigue and fever.  Gastrointestinal:  Negative for abdominal pain, diarrhea, nausea and vomiting.  Genitourinary:  Positive for dysuria and vaginal pain (irritation). Negative for frequency, pelvic pain, urgency, vaginal bleeding and vaginal discharge.     Physical Exam Triage Vital Signs ED Triage Vitals  Enc Vitals Group     BP 10/15/22 1518 120/70     Pulse Rate 10/15/22 1516 72     Resp 10/15/22 1516 16     Temp 10/15/22 1518 98 F (36.7  C)     Temp Source 10/15/22 1518 Oral     SpO2 10/15/22 1516 98 %     Weight --      Height --      Head Circumference --      Peak Flow --      Pain Score 10/15/22 1517 0     Pain Loc --      Pain Edu? --      Excl. in [place]? --    No data found.  Updated Vital Signs BP 120/70   Pulse 72   Temp 98 F (36.7 C) (Oral)   Resp 16   SpO2 98%   Visual Acuity Right Eye Distance:   Left Eye Distance:   Bilateral Distance:    Right Eye Near:   Left Eye Near:    Bilateral Near:     Physical Exam Vitals reviewed.  Constitutional:      General: She is awake. She is not in acute distress.    Appearance: Normal appearance. She is well-developed. She is not ill-appearing.     Comments: Very pleasant female appear stated age in no acute distress sitting comfortably in exam room  HENT:     Head: Normocephalic and atraumatic.  Cardiovascular:     Rate and Rhythm: Normal rate and regular rhythm.     Heart sounds: Normal heart sounds, S1 normal and S2 normal. No murmur heard. Pulmonary:     Effort: Pulmonary effort is normal.     Breath sounds: Normal breath sounds. No wheezing, rhonchi or rales.     Comments: Clear to auscultation bilaterally Abdominal:     General: Bowel sounds are normal.     Palpations: Abdomen is soft.     Tenderness: There is no abdominal tenderness. There is no right CVA tenderness, left CVA tenderness, guarding or rebound.  Psychiatric:        Behavior: Behavior is cooperative.      UC Treatments / Results  Labs (all labs ordered are listed, but only abnormal results are displayed) Labs Reviewed  POCT URINALYSIS DIPSTICK, ED / UC  POC URINE PREG, ED  CERVICOVAGINAL ANCILLARY ONLY    EKG   Radiology No results found.  Procedures Procedures (including critical care time)  Medications Ordered in UC Medications - No data to display  Initial Impression / Assessment and Plan / UC Course  I have reviewed the triage vital signs and the nursing  notes.  Pertinent labs & imaging results that were available during my care of the patient were reviewed by me and considered in my  medical decision making (see chart for details).     Patient is well-appearing, afebrile, nontoxic, nontachycardic.  Urine pregnancy was negative in clinic today.  UA showed no evidence of infection.  Concern for vaginal irritation as etiology of symptoms.  Will defer treatment until cytology swab results are available.  She was encouraged to use hypoallergenic soaps and detergents and wear loosefitting cotton underwear.  She is to avoid sexual activity until results are available.  Discussed that if she has any worsening or changing symptoms including abdominal pain, pelvic pain, fever, nausea, vomiting, discharge she should be seen immediately.  Strict return precautions given.  Patient declined excuse note.  Final Clinical Impressions(s) / UC Diagnoses   Final diagnoses:  Vaginal irritation  Dysuria     Discharge Instructions      Your urine was normal with no evidence of infection.  We will contact you if your swab is positive.  Please monitor your MyChart for these results.  Wear loosefitting cotton underwear and use hypoallergenic soaps and detergents.  If you develop any additional symptoms including pelvic pain, abdominal pain, fever, nausea, vomiting you should be seen immediately.     ED Prescriptions   None    PDMP not reviewed this encounter.   Terrilee Croak, PA-C 10/15/22 1614

## 2022-10-15 NOTE — ED Triage Notes (Signed)
Patient presents to [facility] for evaluation of 2 days of some very mild burning with urination, some frequency, and vaginal itching.

## 2022-10-16 ENCOUNTER — Telehealth (HOSPITAL_COMMUNITY): Payer: Self-pay | Admitting: Emergency Medicine

## 2022-10-16 LAB — CERVICOVAGINAL ANCILLARY ONLY
Bacterial Vaginitis (gardnerella): POSITIVE — AB
Candida Glabrata: NEGATIVE
Candida Vaginitis: NEGATIVE
Chlamydia: NEGATIVE
Comment: NEGATIVE
Comment: NEGATIVE
Comment: NEGATIVE
Comment: NEGATIVE
Comment: NEGATIVE
Comment: NORMAL
Neisseria Gonorrhea: NEGATIVE
Trichomonas: NEGATIVE

## 2022-10-16 MED ORDER — METRONIDAZOLE 0.75 % VA GEL: 1.0000 | Freq: Every day | VAGINAL | 0 refills | Status: AC

## 2022-11-27 ENCOUNTER — Ambulatory Visit (HOSPITAL_COMMUNITY)
Admission: EM | Admit: 2022-11-27 | Discharge: 2022-11-27 | Disposition: A | Discharge status: Home / Self Care | Payer: Medicaid Other | Attending: Physician Assistant | Admitting: Physician Assistant

## 2022-11-27 ENCOUNTER — Encounter (HOSPITAL_COMMUNITY): Payer: Self-pay

## 2022-11-27 DIAGNOSIS — N898 Other specified noninflammatory disorders of vagina: Secondary | ICD-10-CM | POA: Diagnosis not present

## 2022-11-27 DIAGNOSIS — N3001 Acute cystitis with hematuria: Secondary | ICD-10-CM

## 2022-11-27 LAB — POCT URINALYSIS DIP (MANUAL ENTRY)
Bilirubin, UA: NEGATIVE
Glucose, UA: NEGATIVE mg/dL
Ketones, POC UA: NEGATIVE mg/dL
Nitrite, UA: POSITIVE — AB
Protein Ur, POC: NEGATIVE mg/dL
Spec Grav, UA: >=1.03 — AB (ref 1.010–1.025)
Urobilinogen, UA: 1 E.U./dL
pH, UA: 6 (ref 5.0–8.0)

## 2022-11-27 LAB — POCT URINE PREGNANCY: Preg Test, Ur: NEGATIVE

## 2022-11-27 MED ORDER — NITROFURANTOIN MONOHYD MACRO 100 MG PO CAPS: 100.0000 mg | ORAL_CAPSULE | Freq: Two times a day (BID) | ORAL | 0 refills | Status: DC

## 2022-11-27 NOTE — ED Triage Notes (Signed)
Patient c/o dysuria, urinary frequency, and a foul odor to her urine x 1 week.  Patient states she is having a cloudy white/gray vaginal discharge and painful intercourse x 2 weeks.

## 2022-11-27 NOTE — ED Provider Notes (Signed)
[facility]    CSN: [id number] Arrival date & time: 11/27/22  1253      History   Chief Complaint Chief Complaint  Patient presents with   Dysuria   Urinary Frequency   STD testing    HPI Natalie [name] is a 22 y.o. female.   Patient presents today with a several week history of UTI symptoms.  She reports dysuria, malodorous urine, urinary frequency.  She does report some lower abdominal pain but denies any pelvic pain, fever, nausea, vomiting.  She has had some vaginal discharge which she describes as malodorous, copious, white/gray.  She has a history of BV with similar presentation.  She was last treated March 2024.  Denies additional antibiotics in the past 90 days.  She denies any recent urogenital procedure, self-catheterization, single kidney.  Denies history of nephrolithiasis.  She has no concern for pregnancy as she has an IUD.  Denies history of diabetes and does not take SGLT2 inhibitor.    Past Medical History:  Diagnosis Date   Allergy    Anemia affecting pregnancy in third trimester 10/04/2020   Asthma    Cesarean delivery delivered 12/10/2020   Breech presentation   Sciatica of right side 11/07/2020   Tonsillitis    Trichomoniasis     Patient Active Problem List   Diagnosis Date Noted   Depression 03/02/2021   IUD (intrauterine device) in place 12/10/2020   Hidradenitis suppurativa 10/08/2019   Dysmenorrhea 05/02/2015   Asthma, mild intermittent, well-controlled 10/15/2013   Allergic rhinitis 10/15/2013    Past Surgical History:  Procedure Laterality Date   CESAREAN SECTION N/A 12/10/2020   Procedure: CESAREAN SECTION;  Surgeon: Myna Hidalgo, DO;  Location: [facility];  Service: Obstetrics;  Laterality: N/A;   HYDRADENITIS EXCISION Bilateral 12/08/2019   Procedure: WIDE EXCISION BILATERAL AXILLARY HIDRADENITIS;  Surgeon: Abigail Miyamoto, MD;  Location: [facility];  Service: General;  Laterality: Bilateral;    OB History     Gravida  1    Para      Term      Preterm      AB      Living         SAB      IAB      Ectopic      Multiple      Live Births               Home Medications    Prior to Admission medications   Medication Sig Start Date End Date Taking? Authorizing Provider  nitrofurantoin, macrocrystal-monohydrate, (MACROBID) 100 MG capsule Take 1 capsule (100 mg total) by mouth 2 (two) times daily. 11/27/22  Yes Tifanie Gardiner K, PA-C  sertraline (ZOLOFT) 100 MG tablet Take 1 tablet (100 mg total) by mouth daily. 04/12/21   Venora Maples, MD    Family History Family History  Problem Relation Age of Onset   Learning disabilities Mother    Mental illness Mother        Depression   Diabetes Paternal Aunt    Asthma Maternal Grandmother    Hypertension Maternal Grandmother    Learning disabilities Maternal Grandmother    Mental illness Maternal Grandmother        Depression   Stroke Maternal Grandmother    Hypertension Maternal Grandfather    Alcohol abuse Maternal Grandfather    Drug abuse Maternal Grandfather     Social History Social History   Tobacco Use   Smoking status: Former  Types: Cigars   Smokeless tobacco: Never   Tobacco comments:    last used 03-24-20  Vaping Use   Vaping Use: Every day   Substances: Nicotine  Substance Use Topics   Alcohol use: Yes   Drug use: Not Currently    Types: Marijuana     Allergies   Pollen extract   Review of Systems Review of Systems  Constitutional:  Positive for activity change. Negative for appetite change and fatigue.  Gastrointestinal:  Negative for abdominal pain, diarrhea, nausea and vomiting.  Genitourinary:  Positive for dysuria, frequency, urgency and vaginal discharge. Negative for pelvic pain, vaginal bleeding and vaginal pain.     Physical Exam Triage Vital Signs ED Triage Vitals  Enc Vitals Group     BP 11/27/22 1342 101/64     Pulse Rate 11/27/22 1342 87     Resp 11/27/22 1342 16     Temp 11/27/22  1342 97.9 F (36.6 C)     Temp Source 11/27/22 1342 Oral     SpO2 11/27/22 1342 98 %     Weight --      Height --      Head Circumference --      Peak Flow --      Pain Score 11/27/22 1344 6     Pain Loc --      Pain Edu? --      Excl. in [place]? --    No data found.  Updated Vital Signs BP 101/64 (BP Location: Right Arm)   Pulse 87   Temp 97.9 F (36.6 C) (Oral)   Resp 16   SpO2 98%   Visual Acuity Right Eye Distance:   Left Eye Distance:   Bilateral Distance:    Right Eye Near:   Left Eye Near:    Bilateral Near:     Physical Exam Vitals reviewed.  Constitutional:      General: She is awake. She is not in acute distress.    Appearance: Normal appearance. She is well-developed. She is not ill-appearing.     Comments: Very pleasant female appears stated age in no acute distress sitting comfortably in exam room  HENT:     Head: Normocephalic and atraumatic.  Cardiovascular:     Rate and Rhythm: Normal rate and regular rhythm.     Heart sounds: Normal heart sounds, S1 normal and S2 normal. No murmur heard. Pulmonary:     Effort: Pulmonary effort is normal.     Breath sounds: Normal breath sounds. No wheezing, rhonchi or rales.     Comments: Clear to auscultation bilaterally Abdominal:     General: Bowel sounds are normal.     Palpations: Abdomen is soft.     Tenderness: There is abdominal tenderness in the suprapubic area. There is no right CVA tenderness, left CVA tenderness, guarding or rebound. Negative signs include Rovsing's sign, McBurney's sign and psoas sign.  Psychiatric:        Behavior: Behavior is cooperative.      UC Treatments / Results  Labs (all labs ordered are listed, but only abnormal results are displayed) Labs Reviewed  POCT URINALYSIS DIP (MANUAL ENTRY) - Abnormal; Notable for the following components:      Result Value   Spec Grav, UA >=1.030 (*)    Blood, UA small (*)    Nitrite, UA Positive (*)    Leukocytes, UA Small (1+) (*)     All other components within normal limits  URINE CULTURE  POCT URINE  PREGNANCY  CERVICOVAGINAL ANCILLARY ONLY    EKG   Radiology No results found.  Procedures Procedures (including critical care time)  Medications Ordered in UC Medications - No data to display  Initial Impression / Assessment and Plan / UC Course  I have reviewed the triage vital signs and the nursing notes.  Pertinent labs & imaging results that were available during my care of the patient were reviewed by me and considered in my medical decision making (see chart for details).     Patient is well-appearing, afebrile, nontoxic, nontachycardic.  Urine pregnancy was negative.  UA showed positive nitrites and leukocyte esterase consistent with UTI.  Will empirically treat with nitrofurantoin.  Urine culture was obtained and is pending.  We will contact her if need to change or discontinue her antibiotics based on culture results.  STI swab was collected and is pending.  We will arrange additional treatment based on these results.  She was encouraged to wear loosefitting cotton underwear and use hypoallergenic soaps and detergents.  Recommend that she push fluids.  We discussed that if she has any worsening or changing symptoms including abdominal pain, pelvic pain, fever, nausea, vomiting she needs to be seen immediately.  Strict return precautions given.  Final Clinical Impressions(s) / UC Diagnoses   Final diagnoses:  Acute cystitis with hematuria  Vaginal discharge     Discharge Instructions      Your urine appears that you have a urinary tract infection.  Please start nitrofurantoin twice daily for 5 days.  Make sure that you are drinking plenty of fluid.  We will send this for culture and contact you if need to stop or change her antibiotics.  We have also sent off a swab to make sure you do not have a vaginal infection.  We will contact you if this is positive we will need to arrange additional treatment.   If you develop any abdominal pain, pelvic pain, fever, nausea, vomiting you should be seen immediately.     ED Prescriptions     Medication Sig Dispense Auth. Provider   nitrofurantoin, macrocrystal-monohydrate, (MACROBID) 100 MG capsule Take 1 capsule (100 mg total) by mouth 2 (two) times daily. 10 capsule Sharay Bellissimo, Noberto Retort, PA-C      PDMP not reviewed this encounter.   Jeani Hawking, PA-C 11/27/22 1417

## 2022-11-27 NOTE — Discharge Instructions (Signed)
Your urine appears that you have a urinary tract infection.  Please start nitrofurantoin twice daily for 5 days.  Make sure that you are drinking plenty of fluid.  We will send this for culture and contact you if need to stop or change her antibiotics.  We have also sent off a swab to make sure you do not have a vaginal infection.  We will contact you if this is positive we will need to arrange additional treatment.  If you develop any abdominal pain, pelvic pain, fever, nausea, vomiting you should be seen immediately.

## 2022-11-29 LAB — CERVICOVAGINAL ANCILLARY ONLY
Bacterial Vaginitis (gardnerella): POSITIVE — AB
Candida Glabrata: NEGATIVE
Candida Vaginitis: NEGATIVE
Chlamydia: POSITIVE — AB
Comment: NEGATIVE
Comment: NEGATIVE
Comment: NEGATIVE
Comment: NEGATIVE
Comment: NEGATIVE
Comment: NORMAL
Neisseria Gonorrhea: NEGATIVE
Trichomonas: NEGATIVE

## 2022-11-30 ENCOUNTER — Telehealth (HOSPITAL_COMMUNITY): Payer: Self-pay | Admitting: Emergency Medicine

## 2022-11-30 MED ORDER — DOXYCYCLINE HYCLATE 100 MG PO CAPS: 100.0000 mg | ORAL_CAPSULE | Freq: Two times a day (BID) | ORAL | 0 refills | Status: AC

## 2022-11-30 MED ORDER — METRONIDAZOLE 0.75 % VA GEL: 1.0000 | Freq: Every day | VAGINAL | 0 refills | Status: AC

## 2022-12-17 ENCOUNTER — Ambulatory Visit
Admission: EM | Admit: 2022-12-17 | Discharge: 2022-12-17 | Discharge status: Against Medical Advice | Payer: Medicaid Other

## 2022-12-18 ENCOUNTER — Ambulatory Visit: Payer: Medicaid Other

## 2022-12-24 DIAGNOSIS — N898 Other specified noninflammatory disorders of vagina: Secondary | ICD-10-CM | POA: Diagnosis not present

## 2022-12-24 DIAGNOSIS — J45909 Unspecified asthma, uncomplicated: Secondary | ICD-10-CM | POA: Diagnosis not present

## 2022-12-24 DIAGNOSIS — R300 Dysuria: Secondary | ICD-10-CM | POA: Diagnosis not present

## 2023-02-06 ENCOUNTER — Encounter (HOSPITAL_COMMUNITY): Payer: Self-pay

## 2023-02-06 ENCOUNTER — Ambulatory Visit (HOSPITAL_COMMUNITY)
Admission: EM | Admit: 2023-02-06 | Discharge: 2023-02-06 | Disposition: A | Discharge status: Home / Self Care | Payer: Medicaid Other | Attending: Nurse Practitioner | Admitting: Nurse Practitioner

## 2023-02-06 DIAGNOSIS — L732 Hidradenitis suppurativa: Secondary | ICD-10-CM | POA: Diagnosis not present

## 2023-02-06 DIAGNOSIS — N898 Other specified noninflammatory disorders of vagina: Secondary | ICD-10-CM

## 2023-02-06 DIAGNOSIS — Z202 Contact with and (suspected) exposure to infections with a predominantly sexual mode of transmission: Secondary | ICD-10-CM

## 2023-02-06 LAB — POCT URINALYSIS DIP (MANUAL ENTRY)
Bilirubin, UA: NEGATIVE
Blood, UA: NEGATIVE
Glucose, UA: NEGATIVE mg/dL
Ketones, POC UA: NEGATIVE mg/dL
Nitrite, UA: NEGATIVE
Protein Ur, POC: NEGATIVE mg/dL
Spec Grav, UA: 1.025 (ref 1.010–1.025)
Urobilinogen, UA: 1 E.U./dL
pH, UA: 6.5 (ref 5.0–8.0)

## 2023-02-06 LAB — POCT URINE PREGNANCY: Preg Test, Ur: NEGATIVE

## 2023-02-06 MED ORDER — DOXYCYCLINE HYCLATE 100 MG PO CAPS: 100.0000 mg | ORAL_CAPSULE | Freq: Two times a day (BID) | ORAL | 0 refills | Status: AC

## 2023-02-06 MED ORDER — CHLORHEXIDINE GLUCONATE 4 % EX SOLN: Freq: Every day | CUTANEOUS | 0 refills | Status: DC | PRN

## 2023-02-06 NOTE — ED Triage Notes (Signed)
Pt presents to the office for vaginal discharge and dysuria x 3 days. Pt reports pelvic pain.

## 2023-02-06 NOTE — ED Provider Notes (Signed)
[facility]    CSN: [id number] Arrival date & time: 02/06/23  1438      History   Chief Complaint Chief Complaint  Patient presents with   Hip Pain   Vaginal Discharge    HPI Natalie [name] is a 22 y.o. female.   Patient presents today with 2-week history of dysuria, urinary frequency and urgency, vaginal irritation, vaginal discharge that is a "odd" color, sticky and liquidy, and has a malodorous smell.  No abdominal pain back pain, fever, nausea/vomiting.  She does have some intermittent pelvic pain.  Reports she is sexually active with a female partner and has not been using condoms.  Reports she recently found pills that he was taking to treat chlamydia and thinks she may have been exposed to chlamydia.  Patient also reports she has history of hidradenitis suppurativa under both underarms and had surgery to remove the abscesses.  She reports she has the same in her groin and is wondering what she can do for it.  She reports she does not currently have a flare, however when she does it makes it very painful to walk.  Patient has an IUD and does not get a menstrual cycle as a result.    Past Medical History:  Diagnosis Date   Allergy    Anemia affecting pregnancy in third trimester 10/04/2020   Asthma    Cesarean delivery delivered 12/10/2020   Breech presentation   Sciatica of right side 11/07/2020   Tonsillitis    Trichomoniasis     Patient Active Problem List   Diagnosis Date Noted   Depression 03/02/2021   IUD (intrauterine device) in place 12/10/2020   Hidradenitis suppurativa 10/08/2019   Dysmenorrhea 05/02/2015   Asthma, mild intermittent, well-controlled 10/15/2013   Allergic rhinitis 10/15/2013    Past Surgical History:  Procedure Laterality Date   CESAREAN SECTION N/A 12/10/2020   Procedure: CESAREAN SECTION;  Surgeon: Myna Hidalgo, DO;  Location: [facility];  Service: Obstetrics;  Laterality: N/A;   HYDRADENITIS EXCISION Bilateral 12/08/2019    Procedure: WIDE EXCISION BILATERAL AXILLARY HIDRADENITIS;  Surgeon: Abigail Miyamoto, MD;  Location: [facility];  Service: General;  Laterality: Bilateral;    OB History     Gravida  1   Para      Term      Preterm      AB      Living         SAB      IAB      Ectopic      Multiple      Live Births               Home Medications    Prior to Admission medications   Medication Sig Start Date End Date Taking? Authorizing Provider  chlorhexidine (HIBICLENS) 4 % external liquid Apply topically daily as needed. 02/06/23  Yes Valentino Nose, NP  doxycycline (VIBRAMYCIN) 100 MG capsule Take 1 capsule (100 mg total) by mouth 2 (two) times daily for 7 days. 02/06/23 02/13/23 Yes Valentino Nose, NP  cetirizine (ZYRTEC) 10 MG tablet Take 10 mg by mouth daily.    [provider]  sertraline (ZOLOFT) 100 MG tablet Take 1 tablet (100 mg total) by mouth daily. 04/12/21   Venora Maples, MD    Family History Family History  Problem Relation Age of Onset   Learning disabilities Mother    Mental illness Mother        Depression  Diabetes Paternal Aunt    Asthma Maternal Grandmother    Hypertension Maternal Grandmother    Learning disabilities Maternal Grandmother    Mental illness Maternal Grandmother        Depression   Stroke Maternal Grandmother    Hypertension Maternal Grandfather    Alcohol abuse Maternal Grandfather    Drug abuse Maternal Grandfather     Social History Social History   Tobacco Use   Smoking status: Former    Types: Cigars   Smokeless tobacco: Never   Tobacco comments:    last used 03-24-20  Vaping Use   Vaping Use: Every day   Substances: Nicotine  Substance Use Topics   Alcohol use: Yes   Drug use: Not Currently    Types: Marijuana     Allergies   Pollen extract   Review of Systems Review of Systems Per HPI  Physical Exam Triage Vital Signs ED Triage Vitals [02/06/23 1453]  Enc Vitals Group     BP 106/74      Pulse Rate 82     Resp 18     Temp 99 F (37.2 C)     Temp Source Oral     SpO2 98 %     Weight      Height      Head Circumference      Peak Flow      Pain Score      Pain Loc      Pain Edu?      Excl. in GC?    No data found.  Updated Vital Signs BP 106/74 (BP Location: Left Arm)   Pulse 82   Temp 99 F (37.2 C) (Oral)   Resp 18   SpO2 98%   Visual Acuity Right Eye Distance:   Left Eye Distance:   Bilateral Distance:    Right Eye Near:   Left Eye Near:    Bilateral Near:     Physical Exam Vitals and nursing note reviewed. Exam conducted with a chaperone present (Demetric Morton, CMA).  Constitutional:      General: She is not in acute distress.    Appearance: Normal appearance. She is not toxic-appearing.  HENT:     Mouth/Throat:     Mouth: Mucous membranes are moist.     Pharynx: Oropharynx is clear.  Pulmonary:     Effort: Pulmonary effort is normal.  Abdominal:     General: Abdomen is flat. Bowel sounds are normal. There is no distension.     Palpations: Abdomen is soft.     Tenderness: There is no abdominal tenderness. There is no guarding.     Hernia: There is no hernia in the left inguinal area or right inguinal area.  Genitourinary:    Exam position: Lithotomy position.     Pubic Area: No rash.      Labia:        Right: No rash, tenderness or lesion.        Left: No rash, tenderness or lesion.      Vagina: Normal. No vaginal discharge or tenderness.     Comments: Multiple abscesses in different stages of healing bilateral groin Lymphadenopathy:     Lower Body: No right inguinal adenopathy. No left inguinal adenopathy.  Skin:    General: Skin is warm and dry.     Coloration: Skin is not jaundiced or pale.     Findings: No erythema.  Neurological:     Mental Status: She is alert and oriented  to person, place, and time.     Motor: No weakness.     Gait: Gait normal.  Psychiatric:        Mood and Affect: Mood normal.        Behavior:  Behavior is cooperative.      UC Treatments / Results  Labs (all labs ordered are listed, but only abnormal results are displayed) Labs Reviewed  POCT URINALYSIS DIP (MANUAL ENTRY) - Abnormal; Notable for the following components:      Result Value   Leukocytes, UA Trace (*)    All other components within normal limits  URINE CULTURE  HIV ANTIBODY (ROUTINE TESTING W REFLEX)  RPR  POCT URINE PREGNANCY  CERVICOVAGINAL ANCILLARY ONLY    EKG   Radiology No results found.  Procedures Procedures (including critical care time)  Medications Ordered in UC Medications - No data to display  Initial Impression / Assessment and Plan / UC Course  I have reviewed the triage vital signs and the nursing notes.  Pertinent labs & imaging results that were available during my care of the patient were reviewed by me and considered in my medical decision making (see chart for details).   Patient is well-appearing, normotensive, afebrile, not tachycardic, not tachypneic, oxygenating well on room air.    1. Vaginal discharge 2. Exposure to chlamydia Vaginal cytology is pending Will treat for chlamydia with doxycycline 100 mg twice daily for 7 days Recommended condom use with every sexual encounter and condoms given today Urinalysis with trace leukocyte esterase-urine culture pending Treat as indicated UPT negative today  3. Hidradenitis suppurativa Recommended starting with daily Hibiclens cleaning Recommended follow-up with OB/GYN and contact information given to discuss treatment with no improvement or recurrence  The patient was given the opportunity to ask questions.  All questions answered to their satisfaction.  The patient is in agreement to this plan.    Final Clinical Impressions(s) / UC Diagnoses   Final diagnoses:  Vaginal discharge  Exposure to chlamydia  Hidradenitis suppurativa     Discharge Instructions      We are treating you for chlamydia today since you  think you may have been exposed.  Please do not drink alcohol with the doxycycline and avoid sexual intercourse until has been 7 days since you have completed treatment.  The urine test showed a little bit of white blood cells, we will contact you if the urine culture shows an acute infection that requires treatment.  Will also contact you if any of the blood or vaginal testing today comes back positive.  For the hidradenitis in your groin: Start using the topical Hibiclens solution daily to help clean the skin.  Recommend follow-up with OB/GYN; contact information is provided to discuss long-term management and treatment options.     ED Prescriptions     Medication Sig Dispense Auth. Provider   doxycycline (VIBRAMYCIN) 100 MG capsule Take 1 capsule (100 mg total) by mouth 2 (two) times daily for 7 days. 14 capsule Cathlean Marseilles A, NP   chlorhexidine (HIBICLENS) 4 % external liquid Apply topically daily as needed. 473 mL Valentino Nose, NP      PDMP not reviewed this encounter.   Valentino Nose, NP 02/06/23 1544

## 2023-02-06 NOTE — Discharge Instructions (Addendum)
We are treating you for chlamydia today since you think you may have been exposed.  Please do not drink alcohol with the doxycycline and avoid sexual intercourse until has been 7 days since you have completed treatment.  The urine test showed a little bit of white blood cells, we will contact you if the urine culture shows an acute infection that requires treatment.  Will also contact you if any of the blood or vaginal testing today comes back positive.  For the hidradenitis in your groin: Start using the topical Hibiclens solution daily to help clean the skin.  Recommend follow-up with OB/GYN; contact information is provided to discuss long-term management and treatment options.

## 2023-02-07 LAB — CERVICOVAGINAL ANCILLARY ONLY
Bacterial Vaginitis (gardnerella): POSITIVE — AB
Candida Glabrata: NEGATIVE
Candida Vaginitis: NEGATIVE
Chlamydia: POSITIVE — AB
Comment: NEGATIVE
Comment: NEGATIVE
Comment: NEGATIVE
Comment: NEGATIVE
Comment: NEGATIVE
Comment: NORMAL
Neisseria Gonorrhea: NEGATIVE
Trichomonas: NEGATIVE

## 2023-02-08 ENCOUNTER — Telehealth (HOSPITAL_COMMUNITY): Payer: Self-pay | Admitting: Emergency Medicine

## 2023-02-08 LAB — URINE CULTURE: Culture: NO GROWTH

## 2023-02-08 MED ORDER — METRONIDAZOLE 500 MG PO TABS: 500.0000 mg | ORAL_TABLET | Freq: Two times a day (BID) | ORAL | 0 refills | Status: DC

## 2023-03-13 ENCOUNTER — Ambulatory Visit (HOSPITAL_COMMUNITY)
Admission: EM | Admit: 2023-03-13 | Discharge: 2023-03-13 | Disposition: A | Discharge status: Home / Self Care | Payer: Medicaid Other | Attending: Internal Medicine | Admitting: Internal Medicine

## 2023-03-13 ENCOUNTER — Encounter (HOSPITAL_COMMUNITY): Payer: Self-pay

## 2023-03-13 DIAGNOSIS — R350 Frequency of micturition: Secondary | ICD-10-CM

## 2023-03-13 DIAGNOSIS — L732 Hidradenitis suppurativa: Secondary | ICD-10-CM | POA: Diagnosis not present

## 2023-03-13 DIAGNOSIS — N760 Acute vaginitis: Secondary | ICD-10-CM | POA: Insufficient documentation

## 2023-03-13 LAB — POCT URINALYSIS DIP (MANUAL ENTRY)
Bilirubin, UA: NEGATIVE
Glucose, UA: NEGATIVE mg/dL
Ketones, POC UA: NEGATIVE mg/dL
Leukocytes, UA: NEGATIVE
Nitrite, UA: NEGATIVE
Protein Ur, POC: NEGATIVE mg/dL
Spec Grav, UA: >=1.03 — AB (ref 1.010–1.025)
Urobilinogen, UA: 0.2 E.U./dL
pH, UA: 6 (ref 5.0–8.0)

## 2023-03-13 MED ORDER — FLUCONAZOLE 150 MG PO TABS: ORAL_TABLET | ORAL | 0 refills | Status: DC

## 2023-03-13 MED ORDER — DOXYCYCLINE HYCLATE 100 MG PO CAPS
100.0000 mg | ORAL_CAPSULE | Freq: Two times a day (BID) | ORAL | 0 refills | Status: AC
Start: 2023-03-13 — End: 2023-03-20

## 2023-03-13 NOTE — ED Provider Notes (Signed)
[facility]    CSN: [id number] Arrival date & time: 03/13/23  0859      History   Chief Complaint Chief Complaint  Patient presents with   Urinary Frequency   Vaginal Itching    HPI Natalie [name] is a 22 y.o. female.   Patient here today for evaluation of urinary frequency and vaginal itching that this been ongoing for 1 week.  She denies any genital rashes or lesions.  She states that she has had some discharge but is also been using boric acid.  She denies any known STD exposure.  She has not had any abdominal pain, back pain, nausea or vomiting.  She denies risk of pregnancy as she currently has an IUD.  She also requests prescription for doxycycline.  She has known at hidradenitis suppurativa and states that she has started to have boils in her groin area and doxycycline typically will clear this.  The history is provided by the patient.  Urinary Frequency Pertinent negatives include no abdominal pain and no shortness of breath.  Vaginal Itching Pertinent negatives include no abdominal pain and no shortness of breath.    Past Medical History:  Diagnosis Date   Allergy    Anemia affecting pregnancy in third trimester 10/04/2020   Asthma    Cesarean delivery delivered 12/10/2020   Breech presentation   Sciatica of right side 11/07/2020   Tonsillitis    Trichomoniasis     Patient Active Problem List   Diagnosis Date Noted   Depression 03/02/2021   IUD (intrauterine device) in place 12/10/2020   Hidradenitis suppurativa 10/08/2019   Dysmenorrhea 05/02/2015   Asthma, mild intermittent, well-controlled 10/15/2013   Allergic rhinitis 10/15/2013    Past Surgical History:  Procedure Laterality Date   CESAREAN SECTION N/A 12/10/2020   Procedure: CESAREAN SECTION;  Surgeon: Myna Hidalgo, DO;  Location: [facility];  Service: Obstetrics;  Laterality: N/A;   HYDRADENITIS EXCISION Bilateral 12/08/2019   Procedure: WIDE EXCISION BILATERAL AXILLARY HIDRADENITIS;   Surgeon: Abigail Miyamoto, MD;  Location: [facility];  Service: General;  Laterality: Bilateral;    OB History     Gravida  1   Para      Term      Preterm      AB      Living         SAB      IAB      Ectopic      Multiple      Live Births               Home Medications    Prior to Admission medications   Medication Sig Start Date End Date Taking? Authorizing Provider  doxycycline (VIBRAMYCIN) 100 MG capsule Take 1 capsule (100 mg total) by mouth 2 (two) times daily for 7 days. 03/13/23 03/20/23 Yes Tomi Bamberger, PA-C  fluconazole (DIFLUCAN) 150 MG tablet Take one tab PO today and repeat dose in 3 days if symptoms persist 03/13/23  Yes Tomi Bamberger, PA-C  cetirizine (ZYRTEC) 10 MG tablet Take 10 mg by mouth daily.    [provider]  sertraline (ZOLOFT) 100 MG tablet Take 1 tablet (100 mg total) by mouth daily. 04/12/21   Venora Maples, MD    Family History Family History  Problem Relation Age of Onset   Learning disabilities Mother    Mental illness Mother        Depression   Diabetes Paternal Aunt  Asthma Maternal Grandmother    Hypertension Maternal Grandmother    Learning disabilities Maternal Grandmother    Mental illness Maternal Grandmother        Depression   Stroke Maternal Grandmother    Hypertension Maternal Grandfather    Alcohol abuse Maternal Grandfather    Drug abuse Maternal Grandfather     Social History Social History   Tobacco Use   Smoking status: Former    Types: Cigars   Smokeless tobacco: Never   Tobacco comments:    last used 03-24-20  Vaping Use   Vaping status: Every Day   Substances: Nicotine  Substance Use Topics   Alcohol use: Yes   Drug use: Not Currently    Types: Marijuana     Allergies   Pollen extract   Review of Systems Review of Systems  Constitutional:  Negative for chills and fever.  Eyes:  Negative for discharge and redness.  Respiratory:  Negative for shortness of breath.    Gastrointestinal:  Negative for abdominal pain, nausea and vomiting.  Genitourinary:  Positive for frequency and vaginal discharge.  Musculoskeletal:  Negative for back pain.     Physical Exam Triage Vital Signs ED Triage Vitals  Encounter Vitals Group     BP 03/13/23 0921 108/73     Systolic BP Percentile --      Diastolic BP Percentile --      Pulse Rate 03/13/23 0921 83     Resp 03/13/23 0921 14     Temp 03/13/23 0921 97.7 F (36.5 C)     Temp Source 03/13/23 0921 Oral     SpO2 03/13/23 0921 97 %     Weight --      Height --      Head Circumference --      Peak Flow --      Pain Score 03/13/23 0923 0     Pain Loc --      Pain Education --      Exclude from Growth Chart --    No data found.  Updated Vital Signs BP 108/73 (BP Location: Left Arm)   Pulse 83   Temp 97.7 F (36.5 C) (Oral)   Resp 14   SpO2 97%      Physical Exam Vitals and nursing note reviewed.  Constitutional:      General: She is not in acute distress.    Appearance: Normal appearance. She is not ill-appearing.  HENT:     Head: Normocephalic and atraumatic.  Eyes:     Conjunctiva/sclera: Conjunctivae normal.  Cardiovascular:     Rate and Rhythm: Normal rate.  Pulmonary:     Effort: Pulmonary effort is normal.  Neurological:     Mental Status: She is alert.  Psychiatric:        Mood and Affect: Mood normal.        Behavior: Behavior normal.        Thought Content: Thought content normal.      UC Treatments / Results  Labs (all labs ordered are listed, but only abnormal results are displayed) Labs Reviewed  POCT URINALYSIS DIP (MANUAL ENTRY) - Abnormal; Notable for the following components:      Result Value   Clarity, UA cloudy (*)    Spec Grav, UA >=1.030 (*)    Blood, UA small (*)    All other components within normal limits  CERVICOVAGINAL ANCILLARY ONLY    EKG   Radiology No results found.  Procedures Procedures (including critical  care time)  Medications  Ordered in UC Medications - No data to display  Initial Impression / Assessment and Plan / UC Course  I have reviewed the triage vital signs and the nursing notes.  Pertinent labs & imaging results that were available during my care of the patient were reviewed by me and considered in my medical decision making (see chart for details).    UA without concerning findings for UTI.  Will treat to cover possible yeast infection which may also cause urinary symptoms.  Cervicovaginal swab ordered for further evaluation and STD screening.  Recommended follow-up if no gradual improvement with any further concerns.  Doxycycline prescribed discussed that this may make yeast infection worse if present.  2 doses of medication prescribed and encouraged patient to take 1 today and save the other for the end of antibiotic therapy if possible.  Final Clinical Impressions(s) / UC Diagnoses   Final diagnoses:  Urinary frequency  Acute vaginitis  Hidradenitis suppurativa   Discharge Instructions   None    ED Prescriptions     Medication Sig Dispense Auth. Provider   fluconazole (DIFLUCAN) 150 MG tablet Take one tab PO today and repeat dose in 3 days if symptoms persist 2 tablet Erma Pinto F, PA-C   doxycycline (VIBRAMYCIN) 100 MG capsule Take 1 capsule (100 mg total) by mouth 2 (two) times daily for 7 days. 14 capsule Tomi Bamberger, PA-C      PDMP not reviewed this encounter.   Tomi Bamberger, PA-C 03/13/23 1012

## 2023-03-13 NOTE — ED Triage Notes (Signed)
Patient c/o urinary frequency and vaginal itching x 1 week.  Patient also wants to know if she can be prescribed doxycyline for HS and states she has raised areas in both groin areas.

## 2023-03-14 ENCOUNTER — Telehealth (HOSPITAL_COMMUNITY): Payer: Self-pay | Admitting: Emergency Medicine

## 2023-03-14 LAB — CERVICOVAGINAL ANCILLARY ONLY
Bacterial Vaginitis (gardnerella): POSITIVE — AB
Candida Glabrata: NEGATIVE
Candida Vaginitis: NEGATIVE
Chlamydia: NEGATIVE
Comment: NEGATIVE
Comment: NEGATIVE
Comment: NEGATIVE
Comment: NEGATIVE
Comment: NEGATIVE
Comment: NORMAL
Neisseria Gonorrhea: POSITIVE — AB
Trichomonas: NEGATIVE

## 2023-03-14 MED ORDER — METRONIDAZOLE 0.75 % VA GEL: 1.0000 | Freq: Every day | VAGINAL | 0 refills | Status: AC

## 2023-03-14 NOTE — Telephone Encounter (Signed)
Per protocol, patient will need treatment with IM Rocephin 500mg  for positive Gonorrhea Will also need treatment with MEtronidazole.  Contacted patient by phone.  Verified identity using two identifiers.  Provided positive result.  Reviewed safe sex practices, notifying partners, and refraining from sexual activities for 7 days from time of treatment.  Patient verified understanding, all questions answered.   Verified pharmacy, prescription sent [facility] notified

## 2023-04-03 ENCOUNTER — Telehealth: Payer: Self-pay

## 2023-04-03 ENCOUNTER — Ambulatory Visit (HOSPITAL_COMMUNITY)
Admission: EM | Admit: 2023-04-03 | Discharge: 2023-04-03 | Disposition: A | Discharge status: Home / Self Care | Payer: Medicaid Other

## 2023-04-03 ENCOUNTER — Encounter (HOSPITAL_COMMUNITY): Payer: Self-pay

## 2023-04-03 MED ORDER — LIDOCAINE HCL (PF) 1 % IJ SOLN
INTRAMUSCULAR | Status: AC
  Filled 2023-04-03: qty 2

## 2023-04-03 MED ORDER — CEFTRIAXONE SODIUM 500 MG IJ SOLR
500.0000 mg | INTRAMUSCULAR | Status: DC
  Administered 2023-04-03: 500 mg via INTRAMUSCULAR

## 2023-04-03 MED ORDER — CEFTRIAXONE SODIUM 500 MG IJ SOLR
INTRAMUSCULAR | Status: AC
  Filled 2023-04-03: qty 500

## 2023-04-03 NOTE — Telephone Encounter (Signed)
said she got to missed calls from carissa and would like a callback please and thank you

## 2023-04-03 NOTE — Telephone Encounter (Signed)
Spoke with patient regarding treatment or Gonorrhea.  Pt states that she received a shot of Rocephin today.  Berdie Ogren, RN

## 2023-04-03 NOTE — ED Triage Notes (Signed)
Pt states that she was recently here for std but was never treated.

## 2023-04-03 NOTE — Telephone Encounter (Signed)
LM for pt to return my call

## 2023-04-15 ENCOUNTER — Ambulatory Visit
Admission: EM | Admit: 2023-04-15 | Discharge: 2023-04-15 | Disposition: A | Discharge status: Home / Self Care | Payer: Medicaid Other | Attending: Emergency Medicine | Admitting: Emergency Medicine

## 2023-04-15 ENCOUNTER — Ambulatory Visit: Payer: Medicaid Other

## 2023-04-15 DIAGNOSIS — Z202 Contact with and (suspected) exposure to infections with a predominantly sexual mode of transmission: Secondary | ICD-10-CM | POA: Insufficient documentation

## 2023-04-15 DIAGNOSIS — R350 Frequency of micturition: Secondary | ICD-10-CM | POA: Diagnosis not present

## 2023-04-15 DIAGNOSIS — N898 Other specified noninflammatory disorders of vagina: Secondary | ICD-10-CM | POA: Insufficient documentation

## 2023-04-15 DIAGNOSIS — Z3202 Encounter for pregnancy test, result negative: Secondary | ICD-10-CM | POA: Insufficient documentation

## 2023-04-15 LAB — POCT URINALYSIS DIP (MANUAL ENTRY)
Bilirubin, UA: NEGATIVE
Blood, UA: NEGATIVE
Glucose, UA: NEGATIVE mg/dL
Leukocytes, UA: NEGATIVE
Nitrite, UA: NEGATIVE
Protein Ur, POC: NEGATIVE mg/dL
Spec Grav, UA: 1.025 (ref 1.010–1.025)
Urobilinogen, UA: 0.2 U/dL
pH, UA: 5.5 (ref 5.0–8.0)

## 2023-04-15 LAB — POCT URINE PREGNANCY: Preg Test, Ur: NEGATIVE

## 2023-04-15 MED ORDER — METRONIDAZOLE 0.75 % VA GEL: 1.0000 | Freq: Every day | VAGINAL | 0 refills | Status: AC

## 2023-04-15 MED ORDER — METRONIDAZOLE 500 MG PO TABS
500.0000 mg | ORAL_TABLET | Freq: Two times a day (BID) | ORAL | 0 refills | Status: DC
Start: 2023-04-15 — End: 2023-04-15

## 2023-04-15 NOTE — ED Provider Notes (Signed)
Natalie [name]    CSN: [id number] Arrival date & time: 04/15/23  1647      History   Chief Complaint Chief Complaint  Patient presents with   Exposure to STD    HPI [name] is a 22 y.o. female.  Patient presents with vaginal discharge and urinary frequency x 1 week.  She states her sexual partner tested positive for trichomonas this week.  She denies rash, fever, chills, abdominal pain, dysuria, hematuria, pelvic pain, or other symptoms.  Her medical history includes bacterial vaginitis, chlamydia, gonorrhea.  She was treated with ceftriaxone on 04/03/2023.  She was treated with MetroGel on 03/14/2023.  She was treated with metronidazole on 02/08/2023.  The history is provided by the patient and medical records.    Past Medical History:  Diagnosis Date   Allergy    Anemia affecting pregnancy in third trimester 10/04/2020   Asthma    Cesarean delivery delivered 12/10/2020   Breech presentation   Sciatica of right side 11/07/2020   Tonsillitis    Trichomoniasis     Patient Active Problem List   Diagnosis Date Noted   Depression 03/02/2021   IUD (intrauterine device) in place 12/10/2020   Hidradenitis suppurativa 10/08/2019   Dysmenorrhea 05/02/2015   Asthma, mild intermittent, well-controlled 10/15/2013   Allergic rhinitis 10/15/2013    Past Surgical History:  Procedure Laterality Date   CESAREAN SECTION N/A 12/10/2020   Procedure: CESAREAN SECTION;  Surgeon: Myna Hidalgo, DO;  Location: [facility];  Service: Obstetrics;  Laterality: N/A;   HYDRADENITIS EXCISION Bilateral 12/08/2019   Procedure: WIDE EXCISION BILATERAL AXILLARY HIDRADENITIS;  Surgeon: Abigail Miyamoto, MD;  Location: [facility] OR;  Service: General;  Laterality: Bilateral;    OB History     Gravida  1   Para      Term      Preterm      AB      Living         SAB      IAB      Ectopic      Multiple      Live Births               Home Medications    Prior to Admission  medications   Medication Sig Start Date End Date Taking? Authorizing Provider  metroNIDAZOLE (METROGEL) 0.75 % vaginal gel Place 1 Applicatorful vaginally at bedtime for 5 days. 04/15/23 04/20/23 Yes Mickie Bail, NP  cetirizine (ZYRTEC) 10 MG tablet Take 10 mg by mouth daily.    [provider]  fluconazole (DIFLUCAN) 150 MG tablet Take one tab PO today and repeat dose in 3 days if symptoms persist 03/13/23   Tomi Bamberger, PA-C  sertraline (ZOLOFT) 100 MG tablet Take 1 tablet (100 mg total) by mouth daily. 04/12/21   Venora Maples, MD  VENTOLIN HFA 108 [phone] Base) MCG/ACT inhaler SMARTSIG:2 Puff(s) By Mouth Every 6 Hours PRN    [provider]    Family History Family History  Problem Relation Age of Onset   Learning disabilities Mother    Mental illness Mother        Depression   Diabetes Paternal Aunt    Asthma Maternal Grandmother    Hypertension Maternal Grandmother    Learning disabilities Maternal Grandmother    Mental illness Maternal Grandmother        Depression   Stroke Maternal Grandmother    Hypertension Maternal Grandfather    Alcohol abuse Maternal  Grandfather    Drug abuse Maternal Grandfather     Social History Social History   Tobacco Use   Smoking status: Former    Types: Cigars   Smokeless tobacco: Never   Tobacco comments:    last used 03-24-20  Vaping Use   Vaping status: Every Day   Substances: Nicotine  Substance Use Topics   Alcohol use: Yes   Drug use: Not Currently    Types: Marijuana     Allergies   Pollen extract   Review of Systems Review of Systems  Constitutional:  Negative for chills and fever.  Gastrointestinal:  Negative for abdominal pain, nausea and vomiting.  Genitourinary:  Positive for frequency and vaginal discharge. Negative for dysuria, flank pain, hematuria and pelvic pain.  Skin:  Negative for rash and wound.     Physical Exam Triage Vital Signs ED Triage Vitals  Encounter Vitals Group      BP 04/15/23 1655 109/70     Systolic BP Percentile --      Diastolic BP Percentile --      Pulse Rate 04/15/23 1655 77     Resp 04/15/23 1655 18     Temp 04/15/23 1655 99.2 F (37.3 C)     Temp src --      SpO2 04/15/23 1655 98 %     Weight --      Height --      Head Circumference --      Peak Flow --      Pain Score 04/15/23 1653 0     Pain Loc --      Pain Education --      Exclude from Growth Chart --    No data found.  Updated Vital Signs BP 109/70   Pulse 77   Temp 99.2 F (37.3 C)   Resp 18   LMP  (LMP Unknown)   SpO2 98%   Visual Acuity Right Eye Distance:   Left Eye Distance:   Bilateral Distance:    Right Eye Near:   Left Eye Near:    Bilateral Near:     Physical Exam Vitals and nursing note reviewed.  Constitutional:      General: She is not in acute distress.    Appearance: She is well-developed.  HENT:     Mouth/Throat:     Mouth: Mucous membranes are moist.  Cardiovascular:     Rate and Rhythm: Normal rate and regular rhythm.     Heart sounds: Normal heart sounds.  Pulmonary:     Effort: Pulmonary effort is normal. No respiratory distress.     Breath sounds: Normal breath sounds.  Abdominal:     General: Bowel sounds are normal.     Palpations: Abdomen is soft.     Tenderness: There is no abdominal tenderness. There is no right CVA tenderness, left CVA tenderness, guarding or rebound.  Musculoskeletal:     Cervical back: Neck supple.  Skin:    General: Skin is warm and dry.  Neurological:     Mental Status: She is alert.      UC Treatments / Results  Labs (all labs ordered are listed, but only abnormal results are displayed) Labs Reviewed  POCT URINALYSIS DIP (MANUAL ENTRY) - Abnormal; Notable for the following components:      Result Value   Ketones, POC UA trace (5) (*)    All other components within normal limits  POCT URINE PREGNANCY  CERVICOVAGINAL ANCILLARY ONLY    EKG  Radiology No results  found.  Procedures Procedures (including critical care time)  Medications Ordered in UC Medications - No data to display  Initial Impression / Assessment and Plan / UC Course  I have reviewed the triage vital signs and the nursing notes.  Pertinent labs & imaging results that were available during my care of the patient were reviewed by me and considered in my medical decision making (see chart for details).    Vaginal discharge, Exposure to trichomonas, Urinary frequency, Negative pregnancy test.  No indication of UTI.  Urine pregnancy negative.  Patient obtained vaginal self swab for testing.  Treating with MetroGel (patient prefers gel).  Discussed that we will call if test results show the need for additional treatment.  Instructed patient to abstain from sexual activity for at least 7 days.  Instructed her to follow-up with her PCP or gynecologist due to her recurrent symptoms.  Patient agrees to plan of care.   Final Clinical Impressions(s) / UC Diagnoses   Final diagnoses:  Vaginal discharge  Exposure to trichomonas  Urinary frequency  Negative pregnancy test     Discharge Instructions      Use the MetroGel as directed.    Your vaginal tests are pending.  If your test results are positive, we will call you.  You and your sexual partner(s) may require treatment at that time.  Do not have sexual activity for at least 7 days.           ED Prescriptions     Medication Sig Dispense Auth. Provider   metroNIDAZOLE (FLAGYL) 500 MG tablet  (Status: Discontinued) Take 1 tablet (500 mg total) by mouth 2 (two) times daily. 14 tablet Wendee Beavers H, NP   metroNIDAZOLE (METROGEL) 0.75 % vaginal gel Place 1 Applicatorful vaginally at bedtime for 5 days. 50 g Mickie Bail, NP      PDMP not reviewed this encounter.   Mickie Bail, NP 04/15/23 1731

## 2023-04-15 NOTE — ED Triage Notes (Signed)
Pt presents with complaints of exposure to trichomonas, discharge x 1 week, and increased urination. Pt also has a white film on her tongue.

## 2023-04-15 NOTE — Discharge Instructions (Addendum)
Use the MetroGel as directed.    Your vaginal tests are pending.  If your test results are positive, we will call you.  You and your sexual partner(s) may require treatment at that time.  Do not have sexual activity for at least 7 days.

## 2023-04-16 LAB — CERVICOVAGINAL ANCILLARY ONLY
Bacterial Vaginitis (gardnerella): NEGATIVE
Candida Glabrata: NEGATIVE
Candida Vaginitis: NEGATIVE
Chlamydia: NEGATIVE
Comment: NEGATIVE
Comment: NEGATIVE
Comment: NEGATIVE
Comment: NEGATIVE
Comment: NEGATIVE
Comment: NORMAL
Neisseria Gonorrhea: NEGATIVE
Trichomonas: NEGATIVE

## 2023-06-11 ENCOUNTER — Ambulatory Visit
Admission: EM | Admit: 2023-06-11 | Discharge: 2023-06-11 | Disposition: A | Discharge status: Home / Self Care | Payer: Medicaid Other | Attending: Emergency Medicine | Admitting: Emergency Medicine

## 2023-06-11 DIAGNOSIS — R300 Dysuria: Secondary | ICD-10-CM | POA: Diagnosis not present

## 2023-06-11 DIAGNOSIS — N898 Other specified noninflammatory disorders of vagina: Secondary | ICD-10-CM | POA: Insufficient documentation

## 2023-06-11 LAB — POCT URINALYSIS DIP (MANUAL ENTRY)
Bilirubin, UA: NEGATIVE
Glucose, UA: NEGATIVE mg/dL
Ketones, POC UA: NEGATIVE mg/dL
Nitrite, UA: NEGATIVE
Spec Grav, UA: 1.025 (ref 1.010–1.025)
Urobilinogen, UA: 1 U/dL
pH, UA: 7 (ref 5.0–8.0)

## 2023-06-11 LAB — POCT URINE PREGNANCY: Preg Test, Ur: NEGATIVE

## 2023-06-11 NOTE — ED Triage Notes (Signed)
Patient to Urgent Care with complaints of vaginal discharge/ itching. Requests STD testing.   Also w/ complaints of urinary frequency/ suprapubic pressure. Denies any known fevers.   Symptoms started four days ago.

## 2023-06-11 NOTE — ED Provider Notes (Signed)
Natalie [name]    CSN: [id number] Arrival date & time: 06/11/23  1337      History   Chief Complaint Chief Complaint  Patient presents with   Vaginal Discharge    HPI Natalie [name] is a 22 y.o. female.  Patient presents with 4-day history of vaginal discharge, vaginal itching, dysuria, urinary frequency, bladder pressure.  No treatments at home.  No fever, flank pain, hematuria, pelvic pain, or other symptoms.  Patient was seen here on 04/15/2023; diagnosed with vaginal discharge, exposure to trichomonas, urinary frequency, negative pregnancy test; treated with metronidazole; tests came back negative.    The history is provided by the patient and medical records.    Past Medical History:  Diagnosis Date   Allergy    Anemia affecting pregnancy in third trimester 10/04/2020   Asthma    Cesarean delivery delivered 12/10/2020   Breech presentation   Sciatica of right side 11/07/2020   Tonsillitis    Trichomoniasis     Patient Active Problem List   Diagnosis Date Noted   Depression 03/02/2021   IUD (intrauterine device) in place 12/10/2020   Hidradenitis suppurativa 10/08/2019   Dysmenorrhea 05/02/2015   Asthma, mild intermittent, well-controlled 10/15/2013   Allergic rhinitis 10/15/2013    Past Surgical History:  Procedure Laterality Date   CESAREAN SECTION N/A 12/10/2020   Procedure: CESAREAN SECTION;  Surgeon: Myna Hidalgo, DO;  Location: [facility];  Service: Obstetrics;  Laterality: N/A;   HYDRADENITIS EXCISION Bilateral 12/08/2019   Procedure: WIDE EXCISION BILATERAL AXILLARY HIDRADENITIS;  Surgeon: Abigail Miyamoto, MD;  Location: [facility] OR;  Service: General;  Laterality: Bilateral;    OB History     Gravida  1   Para      Term      Preterm      AB      Living         SAB      IAB      Ectopic      Multiple      Live Births               Home Medications    Prior to Admission medications   Medication Sig Start Date End Date  Taking? Authorizing Provider  cetirizine (ZYRTEC) 10 MG tablet Take 10 mg by mouth daily.    [provider]  fluconazole (DIFLUCAN) 150 MG tablet Take one tab PO today and repeat dose in 3 days if symptoms persist Patient not taking: Reported on 06/11/2023 03/13/23   Tomi Bamberger, PA-C  sertraline (ZOLOFT) 100 MG tablet Take 1 tablet (100 mg total) by mouth daily. 04/12/21   Venora Maples, MD  VENTOLIN HFA 108 [phone] Base) MCG/ACT inhaler SMARTSIG:2 Puff(s) By Mouth Every 6 Hours PRN    [provider]    Family History Family History  Problem Relation Age of Onset   Learning disabilities Mother    Mental illness Mother        Depression   Diabetes Paternal Aunt    Asthma Maternal Grandmother    Hypertension Maternal Grandmother    Learning disabilities Maternal Grandmother    Mental illness Maternal Grandmother        Depression   Stroke Maternal Grandmother    Hypertension Maternal Grandfather    Alcohol abuse Maternal Grandfather    Drug abuse Maternal Grandfather     Social History Social History   Tobacco Use   Smoking status: Former    Types: Software engineer  Smokeless tobacco: Never   Tobacco comments:    last used 03-24-20  Vaping Use   Vaping status: Every Day   Substances: Nicotine  Substance Use Topics   Alcohol use: Yes   Drug use: Not Currently    Types: Marijuana     Allergies   Pollen extract   Review of Systems Review of Systems  Constitutional:  Negative for chills and fever.  Gastrointestinal:  Positive for abdominal pain. Negative for constipation, diarrhea and vomiting.  Genitourinary:  Positive for dysuria, frequency and vaginal discharge. Negative for flank pain, hematuria and pelvic pain.  Skin:  Negative for color change and rash.     Physical Exam Triage Vital Signs ED Triage Vitals [06/11/23 1535]  Encounter Vitals Group     BP      Systolic BP Percentile      Diastolic BP Percentile      Pulse      Resp       Temp      Temp src      SpO2      Weight      Height      Head Circumference      Peak Flow      Pain Score 0     Pain Loc      Pain Education      Exclude from Growth Chart    No data found.  Updated Vital Signs BP 111/78   Pulse 78   Temp 98 F (36.7 C)   Resp 18   SpO2 98%   Visual Acuity Right Eye Distance:   Left Eye Distance:   Bilateral Distance:    Right Eye Near:   Left Eye Near:    Bilateral Near:     Physical Exam Constitutional:      General: She is not in acute distress. HENT:     Mouth/Throat:     Mouth: Mucous membranes are moist.  Cardiovascular:     Rate and Rhythm: Normal rate and regular rhythm.  Pulmonary:     Effort: Pulmonary effort is normal. No respiratory distress.  Abdominal:     General: Bowel sounds are normal.     Palpations: Abdomen is soft.     Tenderness: There is no abdominal tenderness. There is no right CVA tenderness, left CVA tenderness, guarding or rebound.  Skin:    General: Skin is warm and dry.  Neurological:     Mental Status: She is alert.      UC Treatments / Results  Labs (all labs ordered are listed, but only abnormal results are displayed) Labs Reviewed  POCT URINALYSIS DIP (MANUAL ENTRY) - Abnormal; Notable for the following components:      Result Value   Clarity, UA cloudy (*)    Blood, UA small (*)    Protein Ur, POC trace (*)    Leukocytes, UA Small (1+) (*)    All other components within normal limits  URINE CULTURE  POCT URINE PREGNANCY  CERVICOVAGINAL ANCILLARY ONLY    EKG   Radiology No results found.  Procedures Procedures (including critical care time)  Medications Ordered in UC Medications - No data to display  Initial Impression / Assessment and Plan / UC Course  I have reviewed the triage vital signs and the nursing notes.  Pertinent labs & imaging results that were available during my care of the patient were reviewed by me and considered in my medical decision making (see  chart for details).  Vaginal discharge, vaginal itching, dysuria.  Urine pregnancy test negative.  Urine culture pending.  Patient obtained vaginal self swab for testing.  Instructed patient to schedule an appointment with a gynecologist or primary care provider due to her recurrent symptoms.  Instructed her to abstain from sexual activity until her test results are back.  She agrees to plan of care.  Final Clinical Impressions(s) / UC Diagnoses   Final diagnoses:  Vaginal discharge  Vaginal itching  Dysuria     Discharge Instructions      A urine culture is pending.    Your vaginal tests are pending.  If your test results are positive, we will call you.  You and your sexual partner(s) may require treatment at that time.  Do not have sexual activity for at least 7 days.    Please schedule an appointment with a gynecologist or primary care provider for your recurrent symptoms.         ED Prescriptions   None    PDMP not reviewed this encounter.   Mickie Bail, NP 06/11/23 1600

## 2023-06-11 NOTE — Discharge Instructions (Addendum)
A urine culture is pending.    Your vaginal tests are pending.  If your test results are positive, we will call you.  You and your sexual partner(s) may require treatment at that time.  Do not have sexual activity for at least 7 days.    Please schedule an appointment with a gynecologist or primary care provider for your recurrent symptoms.

## 2023-06-12 ENCOUNTER — Telehealth: Payer: Self-pay

## 2023-06-12 LAB — CERVICOVAGINAL ANCILLARY ONLY
Bacterial Vaginitis (gardnerella): POSITIVE — AB
Candida Glabrata: NEGATIVE
Candida Vaginitis: NEGATIVE
Chlamydia: NEGATIVE
Comment: NEGATIVE
Comment: NEGATIVE
Comment: NEGATIVE
Comment: NEGATIVE
Comment: NEGATIVE
Comment: NORMAL
Neisseria Gonorrhea: NEGATIVE
Trichomonas: NEGATIVE

## 2023-06-12 MED ORDER — METRONIDAZOLE 500 MG PO TABS: 500.0000 mg | ORAL_TABLET | Freq: Two times a day (BID) | ORAL | 0 refills | Status: AC

## 2023-06-12 NOTE — Telephone Encounter (Signed)
Per protocol, pt requires tx with metronidazole. Reviewed with patient, verified pharmacy, prescription sent.

## 2023-06-13 LAB — URINE CULTURE: Culture: <10000 — AB

## 2023-08-13 ENCOUNTER — Other Ambulatory Visit: Payer: Self-pay

## 2023-08-13 ENCOUNTER — Ambulatory Visit
Admission: RE | Admit: 2023-08-13 | Discharge: 2023-08-13 | Disposition: A | Discharge status: Home / Self Care | Payer: Medicaid Other | Source: Ambulatory Visit | Attending: Emergency Medicine | Admitting: Emergency Medicine

## 2023-08-13 VITALS — BP 120/79 | HR 85 | Temp 98.4°F | Resp 20

## 2023-08-13 DIAGNOSIS — N898 Other specified noninflammatory disorders of vagina: Secondary | ICD-10-CM | POA: Insufficient documentation

## 2023-08-13 MED ORDER — METRONIDAZOLE 0.75 % VA GEL: 1.0000 | Freq: Every day | VAGINAL | 0 refills | Status: AC

## 2023-08-13 NOTE — ED Triage Notes (Signed)
 Patient arrives with complaints of increased vaginal discharge with odor x2 weeks. Tried at home remedies with no relief. Reports no pain.

## 2023-08-13 NOTE — Discharge Instructions (Addendum)
 Today you are being treated prophylactically for  Bacterial vaginosis   Use MetroGel  every night before bed for 7 days  Bacterial vaginosis which results from an overgrowth of one on several organisms that are normally present in your vagina. Vaginosis is an inflammation of the vagina that can result in discharge, itching and pain.  Labs pending 2-3 days, you will be contacted if positive for any sti and treatment will be sent to the pharmacy, you will have to return to the clinic if positive for gonorrhea to receive treatment   Please refrain from having sex until labs results, if positive please refrain from having sex until treatment complete and symptoms resolve   If positive for  Chlamydia  gonorrhea or trichomoniasis please notify partner or partners so they may tested as well  Moving forward, it is recommended you use some form of protection against the transmission of sti infections  such as condoms or dental dams with each sexual encounter     In addition: Avoid baths, hot tubs and whirlpool spas.  Don't use scented or harsh soaps Avoid irritants. These include scented tampons and pads. Wipe from front to back after using the toilet. Don't douche. Your vagina doesn't require cleansing other than normal bathing.  Use a condom.  Wear cotton underwear, this fabric absorbs some moisture.

## 2023-08-13 NOTE — ED Provider Notes (Signed)
 [name]    CSN: [id number] Arrival date & time: 08/13/23  1029      History   Chief Complaint Chief Complaint  Patient presents with   Vaginal Discharge    HPI Natalie [name] is a 23 y.o. female.   Patient presents for evaluation of cloudy vaginal discharge with odor and mild vaginal itching present for 2 weeks.  New sexual partner but no known exposure.  Denies urinary symptoms, abdominal or flank pain, fever.  Has attempted use of boric acid which has been ineffective.  Past Medical History:  Diagnosis Date   Allergy    Anemia affecting pregnancy in third trimester 10/04/2020   Asthma    Cesarean delivery delivered 12/10/2020   Breech presentation   Sciatica of right side 11/07/2020   Tonsillitis    Trichomoniasis     Patient Active Problem List   Diagnosis Date Noted   Depression 03/02/2021   IUD (intrauterine device) in place 12/10/2020   Hidradenitis suppurativa 10/08/2019   Dysmenorrhea 05/02/2015   Asthma, mild intermittent, well-controlled 10/15/2013   Allergic rhinitis 10/15/2013    Past Surgical History:  Procedure Laterality Date   CESAREAN SECTION N/A 12/10/2020   Procedure: CESAREAN SECTION;  Surgeon: Ozan, Jennifer, DO;  Location: [facility];  Service: Obstetrics;  Laterality: N/A;   HYDRADENITIS EXCISION Bilateral 12/08/2019   Procedure: WIDE EXCISION BILATERAL AXILLARY HIDRADENITIS;  Surgeon: Vernetta Berg, MD;  Location: [facility];  Service: General;  Laterality: Bilateral;    OB History     Gravida  1   Para      Term      Preterm      AB      Living         SAB      IAB      Ectopic      Multiple      Live Births               Home Medications    Prior to Admission medications   Medication Sig Start Date End Date Taking? Authorizing Provider  metroNIDAZOLE  (METROGEL ) 0.75 % vaginal gel Place 1 Applicatorful vaginally at bedtime for 7 days. 08/13/23 08/20/23 Yes Zilpha Mcandrew R, NP  cetirizine  (ZYRTEC ) 10  MG tablet Take 10 mg by mouth daily.    [provider]  fluconazole  (DIFLUCAN ) 150 MG tablet Take one tab PO today and repeat dose in 3 days if symptoms persist Patient not taking: Reported on 06/11/2023 03/13/23   Billy Asberry FALCON, PA-C  sertraline  (ZOLOFT ) 100 MG tablet Take 1 tablet (100 mg total) by mouth daily. 04/12/21   Lola Donnice HERO, MD  VENTOLIN  HFA 108 (90 Base) MCG/ACT inhaler SMARTSIG:2 Puff(s) By Mouth Every 6 Hours PRN    [provider]    Family History Family History  Problem Relation Age of Onset   Learning disabilities Mother    Mental illness Mother        Depression   Diabetes Paternal Aunt    Asthma Maternal Grandmother    Hypertension Maternal Grandmother    Learning disabilities Maternal Grandmother    Mental illness Maternal Grandmother        Depression   Stroke Maternal Grandmother    Hypertension Maternal Grandfather    Alcohol abuse Maternal Grandfather    Drug abuse Maternal Grandfather     Social History Social History   Tobacco Use   Smoking status: Former    Types: Software Engineer  Smokeless tobacco: Never   Tobacco comments:    last used 03-24-20  Vaping Use   Vaping status: Every Day   Substances: Nicotine  Substance Use Topics   Alcohol use: Yes   Drug use: Not Currently    Types: Marijuana     Allergies   Pollen extract   Review of Systems Review of Systems  Genitourinary:  Positive for vaginal discharge.     Physical Exam Triage Vital Signs ED Triage Vitals  Encounter Vitals Group     BP 08/13/23 1039 120/79     Systolic BP Percentile --      Diastolic BP Percentile --      Pulse Rate 08/13/23 1039 85     Resp 08/13/23 1039 20     Temp 08/13/23 1039 98.4 F (36.9 C)     Temp Source 08/13/23 1039 Oral     SpO2 08/13/23 1039 98 %     Weight --      Height --      Head Circumference --      Peak Flow --      Pain Score 08/13/23 1041 0     Pain Loc --      Pain Education --      Exclude from  Growth Chart --    No data found.  Updated Vital Signs BP 120/79 (BP Location: Right Arm)   Pulse 85   Temp 98.4 F (36.9 C) (Oral)   Resp 20   SpO2 98%   Visual Acuity Right Eye Distance:   Left Eye Distance:   Bilateral Distance:    Right Eye Near:   Left Eye Near:    Bilateral Near:     Physical Exam Constitutional:      Appearance: Normal appearance.  Eyes:     Extraocular Movements: Extraocular movements intact.  Pulmonary:     Effort: Pulmonary effort is normal.  Genitourinary:    Comments: deferred Neurological:     Mental Status: She is alert and oriented to person, place, and time. Mental status is at baseline.      UC Treatments / Results  Labs (all labs ordered are listed, but only abnormal results are displayed) Labs Reviewed  CERVICOVAGINAL ANCILLARY ONLY    EKG   Radiology No results found.  Procedures Procedures (including critical care time)  Medications Ordered in UC Medications - No data to display  Initial Impression / Assessment and Plan / UC Course  I have reviewed the triage vital signs and the nursing notes.  Pertinent labs & imaging results that were available during my care of the patient were reviewed by me and considered in my medical decision making (see chart for details).  Vaginal discharge  Treating prophylactically for bacterial vaginosis, history of reoccurrence per chart review, prescribed MetroGel  and discussed administration, plan HIV and syphilis testing, STI labs pending will treat per protocol, advised abstinence until lab results, and/or treatment is complete, advised condom use during all sexual encounters moving, may follow-up with urgent care as needed  Final Clinical Impressions(s) / UC Diagnoses   Final diagnoses:  Vaginal discharge     Discharge Instructions      Today you are being treated prophylactically for  Bacterial vaginosis   Use MetroGel  every night before bed for 7 days  Bacterial  vaginosis which results from an overgrowth of one on several organisms that are normally present in your vagina. Vaginosis is an inflammation of the vagina that can result in discharge,  itching and pain.  Labs pending 2-3 days, you will be contacted if positive for any sti and treatment will be sent to the pharmacy, you will have to return to the clinic if positive for gonorrhea to receive treatment   Please refrain from having sex until labs results, if positive please refrain from having sex until treatment complete and symptoms resolve   If positive for  Chlamydia  gonorrhea or trichomoniasis please notify partner or partners so they may tested as well  Moving forward, it is recommended you use some form of protection against the transmission of sti infections  such as condoms or dental dams with each sexual encounter     In addition: Avoid baths, hot tubs and whirlpool spas.  Don't use scented or harsh soaps Avoid irritants. These include scented tampons and pads. Wipe from front to back after using the toilet. Don't douche. Your vagina doesn't require cleansing other than normal bathing.  Use a condom.  Wear cotton underwear, this fabric absorbs some moisture.        ED Prescriptions     Medication Sig Dispense Auth. Provider   metroNIDAZOLE  (METROGEL ) 0.75 % vaginal gel Place 1 Applicatorful vaginally at bedtime for 7 days. 70 g Teresa Shelba SAUNDERS, NP      PDMP not reviewed this encounter.   Teresa Shelba SAUNDERS, NP 08/13/23 1044

## 2023-08-14 ENCOUNTER — Telehealth (HOSPITAL_COMMUNITY): Payer: Self-pay

## 2023-08-14 LAB — CERVICOVAGINAL ANCILLARY ONLY
Bacterial Vaginitis (gardnerella): POSITIVE — AB
Candida Glabrata: NEGATIVE
Candida Vaginitis: NEGATIVE
Chlamydia: POSITIVE — AB
Comment: NEGATIVE
Comment: NEGATIVE
Comment: NEGATIVE
Comment: NEGATIVE
Comment: NEGATIVE
Comment: NORMAL
Neisseria Gonorrhea: NEGATIVE
Trichomonas: POSITIVE — AB

## 2023-08-14 MED ORDER — METRONIDAZOLE 500 MG PO TABS: 500.0000 mg | ORAL_TABLET | Freq: Two times a day (BID) | ORAL | 0 refills | Status: AC

## 2023-08-14 MED ORDER — DOXYCYCLINE HYCLATE 100 MG PO TABS: 100.0000 mg | ORAL_TABLET | Freq: Two times a day (BID) | ORAL | 0 refills | Status: AC

## 2023-08-14 NOTE — Telephone Encounter (Signed)
 Per protocol, pt requires tx with metronidazole and Doxycycline. Reviewed with patient, verified pharmacy, prescription sent.

## 2023-09-06 ENCOUNTER — Ambulatory Visit
Admission: EM | Admit: 2023-09-06 | Discharge: 2023-09-06 | Discharge status: Against Medical Advice | Payer: Medicaid Other

## 2023-09-06 ENCOUNTER — Ambulatory Visit: Payer: Medicaid Other

## 2023-09-17 ENCOUNTER — Ambulatory Visit
Admission: EM | Admit: 2023-09-17 | Discharge: 2023-09-17 | Disposition: A | Discharge status: Home / Self Care | Payer: Medicaid Other | Attending: Emergency Medicine | Admitting: Emergency Medicine

## 2023-09-17 DIAGNOSIS — R300 Dysuria: Secondary | ICD-10-CM | POA: Insufficient documentation

## 2023-09-17 DIAGNOSIS — J101 Influenza due to other identified influenza virus with other respiratory manifestations: Secondary | ICD-10-CM | POA: Diagnosis not present

## 2023-09-17 DIAGNOSIS — N898 Other specified noninflammatory disorders of vagina: Secondary | ICD-10-CM | POA: Diagnosis not present

## 2023-09-17 DIAGNOSIS — R1030 Lower abdominal pain, unspecified: Secondary | ICD-10-CM | POA: Diagnosis not present

## 2023-09-17 LAB — POCT URINALYSIS DIP (MANUAL ENTRY)
Bilirubin, UA: NEGATIVE
Blood, UA: NEGATIVE
Glucose, UA: NEGATIVE mg/dL
Ketones, POC UA: NEGATIVE mg/dL
Nitrite, UA: NEGATIVE
Protein Ur, POC: 30 mg/dL — AB
Spec Grav, UA: >=1.03 — AB (ref 1.010–1.025)
Urobilinogen, UA: 0.2 U/dL
pH, UA: 5.5 (ref 5.0–8.0)

## 2023-09-17 LAB — POC COVID19/FLU A&B COMBO
Covid Antigen, POC: NEGATIVE
Influenza A Antigen, POC: NEGATIVE
Influenza B Antigen, POC: POSITIVE — AB

## 2023-09-17 LAB — POCT URINE PREGNANCY: Preg Test, Ur: NEGATIVE

## 2023-09-17 MED ORDER — OSELTAMIVIR PHOSPHATE 75 MG PO CAPS: 75.0000 mg | ORAL_CAPSULE | Freq: Two times a day (BID) | ORAL | 0 refills | Status: DC

## 2023-09-17 NOTE — ED Provider Notes (Signed)
Natalie [name]    CSN: [id number] Arrival date & time: 09/17/23  1620      History   Chief Complaint Chief Complaint  Patient presents with   Generalized Body Aches   Vaginal Discharge   Urinary Frequency    HPI Natalie [name] is a 23 y.o. female.  Patient presents with 1.5 day history of subjective fever, body aches, headache, sore throat, cough.  No OTC medications taken.  No shortness of breath.  Patient also presents with 2-week history of vaginal discharge, dysuria, urinary frequency, lower abdominal cramping, lower back pain.  No treatments attempted.  No vomiting, diarrhea, constipation, pelvic pain, flank pain, hematuria.  Patient was seen at this urgent care on 08/13/2023 and was positive for chlamydia, trichomonas, bacterial vaginitis.  The history is provided by the patient and medical records.    Past Medical History:  Diagnosis Date   Allergy    Anemia affecting pregnancy in third trimester 10/04/2020   Asthma    Cesarean delivery delivered 12/10/2020   Breech presentation   Sciatica of right side 11/07/2020   Tonsillitis    Trichomoniasis     Patient Active Problem List   Diagnosis Date Noted   Depression 03/02/2021   IUD (intrauterine device) in place 12/10/2020   Hidradenitis suppurativa 10/08/2019   Dysmenorrhea 05/02/2015   Asthma, mild intermittent, well-controlled 10/15/2013   Allergic rhinitis 10/15/2013    Past Surgical History:  Procedure Laterality Date   CESAREAN SECTION N/A 12/10/2020   Procedure: CESAREAN SECTION;  Surgeon: Myna Hidalgo, DO;  Location: [facility];  Service: Obstetrics;  Laterality: N/A;   HYDRADENITIS EXCISION Bilateral 12/08/2019   Procedure: WIDE EXCISION BILATERAL AXILLARY HIDRADENITIS;  Surgeon: Abigail Miyamoto, MD;  Location: [facility];  Service: General;  Laterality: Bilateral;    OB History     Gravida  1   Para      Term      Preterm      AB      Living         SAB      IAB      Ectopic       Multiple      Live Births               Home Medications    Prior to Admission medications   Medication Sig Start Date End Date Taking? Authorizing Provider  oseltamivir (TAMIFLU) 75 MG capsule Take 1 capsule (75 mg total) by mouth every 12 (twelve) hours. 09/17/23  Yes Mickie Bail, NP  cetirizine (ZYRTEC) 10 MG tablet Take 10 mg by mouth daily.    [provider]  fluconazole (DIFLUCAN) 150 MG tablet Take one tab PO today and repeat dose in 3 days if symptoms persist Patient not taking: Reported on 06/11/2023 03/13/23   Tomi Bamberger, PA-C  sertraline (ZOLOFT) 100 MG tablet Take 1 tablet (100 mg total) by mouth daily. 04/12/21   Venora Maples, MD  VENTOLIN HFA 108 [phone] Base) MCG/ACT inhaler SMARTSIG:2 Puff(s) By Mouth Every 6 Hours PRN    [provider]    Family History Family History  Problem Relation Age of Onset   Learning disabilities Mother    Mental illness Mother        Depression   Diabetes Paternal Aunt    Asthma Maternal Grandmother    Hypertension Maternal Grandmother    Learning disabilities Maternal Grandmother    Mental illness Maternal Grandmother  Depression   Stroke Maternal Grandmother    Hypertension Maternal Grandfather    Alcohol abuse Maternal Grandfather    Drug abuse Maternal Grandfather     Social History Social History   Tobacco Use   Smoking status: Former    Types: Cigars   Smokeless tobacco: Never   Tobacco comments:    last used 03-24-20  Vaping Use   Vaping status: Every Day   Substances: Nicotine  Substance Use Topics   Alcohol use: Yes   Drug use: Not Currently    Types: Marijuana     Allergies   Pollen extract   Review of Systems Review of Systems  Constitutional:  Positive for fever. Negative for chills.  HENT:  Positive for sore throat. Negative for ear pain.   Respiratory:  Positive for cough. Negative for shortness of breath.   Gastrointestinal:  Positive for abdominal pain.  Negative for constipation, diarrhea, nausea and vomiting.  Genitourinary:  Positive for dysuria and vaginal discharge. Negative for flank pain, hematuria and pelvic pain.  Neurological:  Positive for headaches.     Physical Exam Triage Vital Signs ED Triage Vitals  Encounter Vitals Group     BP 09/17/23 1805 115/76     Systolic BP Percentile --      Diastolic BP Percentile --      Pulse Rate 09/17/23 1805 73     Resp 09/17/23 1805 20     Temp 09/17/23 1805 98.7 F (37.1 C)     Temp src --      SpO2 09/17/23 1805 98 %     Weight --      Height --      Head Circumference --      Peak Flow --      Pain Score 09/17/23 1803 8     Pain Loc --      Pain Education --      Exclude from Growth Chart --    No data found.  Updated Vital Signs BP 115/76   Pulse 73   Temp 98.7 F (37.1 C)   Resp 20   SpO2 98%   Visual Acuity Right Eye Distance:   Left Eye Distance:   Bilateral Distance:    Right Eye Near:   Left Eye Near:    Bilateral Near:     Physical Exam Constitutional:      General: She is not in acute distress.    Appearance: She is ill-appearing.  HENT:     Right Ear: Tympanic membrane normal.     Left Ear: Tympanic membrane normal.     Nose: Nose normal.     Mouth/Throat:     Mouth: Mucous membranes are moist.     Pharynx: Oropharynx is clear.  Cardiovascular:     Rate and Rhythm: Normal rate and regular rhythm.     Heart sounds: Normal heart sounds.  Pulmonary:     Effort: Pulmonary effort is normal. No respiratory distress.     Breath sounds: Normal breath sounds.  Abdominal:     General: Bowel sounds are normal.     Palpations: Abdomen is soft.     Tenderness: There is no abdominal tenderness. There is no right CVA tenderness, left CVA tenderness, guarding or rebound.  Neurological:     Mental Status: She is alert.      UC Treatments / Results  Labs (all labs ordered are listed, but only abnormal results are displayed) Labs Reviewed  POC  COVID19/FLU A&B  COMBO - Abnormal; Notable for the following components:      Result Value   Influenza B Antigen, POC Positive (*)    All other components within normal limits  POCT URINALYSIS DIP (MANUAL ENTRY) - Abnormal; Notable for the following components:   Spec Grav, UA >=1.030 (*)    Protein Ur, POC =30 (*)    Leukocytes, UA Trace (*)    All other components within normal limits  POCT URINE PREGNANCY  CERVICOVAGINAL ANCILLARY ONLY    EKG   Radiology No results found.  Procedures Procedures (including critical care time)  Medications Ordered in UC Medications - No data to display  Initial Impression / Assessment and Plan / UC Course  I have reviewed the triage vital signs and the nursing notes.  Pertinent labs & imaging results that were available during my care of the patient were reviewed by me and considered in my medical decision making (see chart for details).   Influenza B, vaginal discharge, lower abdominal pain, dysuria.  Afebrile and vital signs are stable.  Lungs are clear and O2 sat is 98% on room air.  Rapid flu positive for influenza B.  COVID test negative.  Patient is within the window for treatment with Tamiflu and would like to take it.  Tamiflu sent to pharmacy.  Urine pregnancy negative.  Urine does not indicate infection.  Patient obtained vaginal self swab for testing.  Instructed her to abstain from sexual activity until the test results are back.  She agrees to plan of care.   Final Clinical Impressions(s) / UC Diagnoses   Final diagnoses:  Influenza B  Vaginal discharge  Lower abdominal pain  Dysuria     Discharge Instructions      Take the Tamiflu as directed.  Take Tylenol or ibuprofen as needed for fever or discomfort.  Follow up with your primary care provider.  Go to the emergency department if you have worsening symptoms.    Urine pregnancy test is negative.  Urine does not indicate infection.  Increase your water intake.  Your  vaginal test are pending.  Do not have sexual activity until all test results are back.     ED Prescriptions     Medication Sig Dispense Auth. Provider   oseltamivir (TAMIFLU) 75 MG capsule Take 1 capsule (75 mg total) by mouth every 12 (twelve) hours. 10 capsule Mickie Bail, NP      PDMP not reviewed this encounter.   Mickie Bail, NP 09/17/23 [phone]

## 2023-09-17 NOTE — ED Triage Notes (Addendum)
Patient to Urgent Care with complaints of body aches/ headaches/ sore throat/ cough. Possible fevers. Symptoms started three days ago.  Also vaginal discharge/ cramping/ lower back pain. Urinary frequency and dysuria. Symptoms started approx 2 weeks ago. Concerned about BV or a UTI.  No otc meds.   (Reports she prefers gel medication if she tests positive for BV).

## 2023-09-17 NOTE — Discharge Instructions (Addendum)
Take the Tamiflu as directed.  Take Tylenol or ibuprofen as needed for fever or discomfort.  Follow up with your primary care provider.  Go to the emergency department if you have worsening symptoms.    Urine pregnancy test is negative.  Urine does not indicate infection.  Increase your water intake.  Your vaginal test are pending.  Do not have sexual activity until all test results are back.

## 2023-09-18 ENCOUNTER — Telehealth: Payer: Self-pay

## 2023-09-18 LAB — CERVICOVAGINAL ANCILLARY ONLY
Bacterial Vaginitis (gardnerella): POSITIVE — AB
Candida Glabrata: NEGATIVE
Candida Vaginitis: POSITIVE — AB
Chlamydia: NEGATIVE
Comment: NEGATIVE
Comment: NEGATIVE
Comment: NEGATIVE
Comment: NEGATIVE
Comment: NEGATIVE
Comment: NORMAL
Neisseria Gonorrhea: NEGATIVE
Trichomonas: NEGATIVE

## 2023-09-18 MED ORDER — METRONIDAZOLE 500 MG PO TABS: 500.0000 mg | ORAL_TABLET | Freq: Two times a day (BID) | ORAL | 0 refills | Status: AC

## 2023-09-18 MED ORDER — FLUCONAZOLE 150 MG PO TABS: 150.0000 mg | ORAL_TABLET | Freq: Once | ORAL | 0 refills | Status: AC

## 2023-09-18 NOTE — Telephone Encounter (Signed)
 Per protocol, pt requires tx with metronidazole and Diflucan.  Rx sent to pharmacy on file.

## 2023-10-15 ENCOUNTER — Ambulatory Visit
Admission: EM | Admit: 2023-10-15 | Discharge: 2023-10-15 | Disposition: A | Discharge status: Home / Self Care | Attending: Emergency Medicine | Admitting: Emergency Medicine

## 2023-10-15 DIAGNOSIS — N898 Other specified noninflammatory disorders of vagina: Secondary | ICD-10-CM | POA: Diagnosis not present

## 2023-10-15 DIAGNOSIS — R1084 Generalized abdominal pain: Secondary | ICD-10-CM | POA: Insufficient documentation

## 2023-10-15 DIAGNOSIS — R300 Dysuria: Secondary | ICD-10-CM | POA: Diagnosis not present

## 2023-10-15 LAB — POCT URINALYSIS DIP (MANUAL ENTRY)
Bilirubin, UA: NEGATIVE
Blood, UA: NEGATIVE
Glucose, UA: NEGATIVE mg/dL
Ketones, POC UA: NEGATIVE mg/dL
Leukocytes, UA: NEGATIVE
Nitrite, UA: NEGATIVE
Protein Ur, POC: NEGATIVE mg/dL
Spec Grav, UA: 1.025
Urobilinogen, UA: 0.2 U/dL
pH, UA: 6.5

## 2023-10-15 LAB — POCT URINE PREGNANCY: Preg Test, Ur: NEGATIVE

## 2023-10-15 NOTE — Discharge Instructions (Addendum)
 Your urine does not show signs of infection.  Urine pregnancy negative.    Your vaginal test are pending.  Do not have sexual activity until all test results are back.    Go to the emergency department if you have worsening symptoms.

## 2023-10-15 NOTE — ED Triage Notes (Signed)
 Patient to Urgent Care with complaints of abdominal "tightness" when eating, urinary frequency, vaginal irritation, vaginal discharge. Denies any known fevers.   Symptoms x1 week.  Possible concern for STD.

## 2023-10-15 NOTE — ED Provider Notes (Signed)
 [name]    CSN: [id number] Arrival date & time: 10/15/23  1416      History   Chief Complaint Chief Complaint  Patient presents with   Vaginal Discharge   Vaginal Itching    HPI Natalie [name] is a 23 y.o. female.  Patient presents with 1 week history of dysuria, urinary frequency, generalized abdominal cramping, vaginal discharge, vaginal irritation.  No fever, rash, pelvic pain, hematuria, flank pain, nausea, vomiting, diarrhea, constipation.  Last bowel movement yesterday evening.  No treatments at home.  Her recent history includes chlamydia, trichomonas, bacterial vaginitis.  The history is provided by the patient and medical records.    Past Medical History:  Diagnosis Date   Allergy    Anemia affecting pregnancy in third trimester 10/04/2020   Asthma    Cesarean delivery delivered 12/10/2020   Breech presentation   Sciatica of right side 11/07/2020   Tonsillitis    Trichomoniasis     Patient Active Problem List   Diagnosis Date Noted   Depression 03/02/2021   IUD (intrauterine device) in place 12/10/2020   Hidradenitis suppurativa 10/08/2019   Dysmenorrhea 05/02/2015   Asthma, mild intermittent, well-controlled 10/15/2013   Allergic rhinitis 10/15/2013    Past Surgical History:  Procedure Laterality Date   CESAREAN SECTION N/A 12/10/2020   Procedure: CESAREAN SECTION;  Surgeon: Myna Hidalgo, DO;  Location: [facility];  Service: Obstetrics;  Laterality: N/A;   HYDRADENITIS EXCISION Bilateral 12/08/2019   Procedure: WIDE EXCISION BILATERAL AXILLARY HIDRADENITIS;  Surgeon: Abigail Miyamoto, MD;  Location: [facility];  Service: General;  Laterality: Bilateral;    OB History     Gravida  1   Para      Term      Preterm      AB      Living         SAB      IAB      Ectopic      Multiple      Live Births               Home Medications    Prior to Admission medications   Medication Sig Start Date End Date Taking? Authorizing  Provider  cetirizine (ZYRTEC) 10 MG tablet Take 10 mg by mouth daily.    [provider]  fluconazole (DIFLUCAN) 150 MG tablet Take one tab PO today and repeat dose in 3 days if symptoms persist Patient not taking: Reported on 06/11/2023 03/13/23   Tomi Bamberger, PA-C  oseltamivir (TAMIFLU) 75 MG capsule Take 1 capsule (75 mg total) by mouth every 12 (twelve) hours. Patient not taking: Reported on 10/15/2023 09/17/23   Mickie Bail, NP  sertraline (ZOLOFT) 100 MG tablet Take 1 tablet (100 mg total) by mouth daily. 04/12/21   Venora Maples, MD  VENTOLIN HFA 108 [phone] Base) MCG/ACT inhaler SMARTSIG:2 Puff(s) By Mouth Every 6 Hours PRN    [provider]    Family History Family History  Problem Relation Age of Onset   Learning disabilities Mother    Mental illness Mother        Depression   Diabetes Paternal Aunt    Asthma Maternal Grandmother    Hypertension Maternal Grandmother    Learning disabilities Maternal Grandmother    Mental illness Maternal Grandmother        Depression   Stroke Maternal Grandmother    Hypertension Maternal Grandfather    Alcohol abuse Maternal Grandfather  Drug abuse Maternal Grandfather     Social History Social History   Tobacco Use   Smoking status: Former    Types: Cigars   Smokeless tobacco: Never   Tobacco comments:    last used 03-24-20  Vaping Use   Vaping status: Every Day   Substances: Nicotine  Substance Use Topics   Alcohol use: Yes   Drug use: Not Currently    Types: Marijuana     Allergies   Pollen extract   Review of Systems Review of Systems  Constitutional:  Negative for chills and fever.  Gastrointestinal:  Positive for abdominal pain. Negative for blood in stool, constipation, diarrhea, nausea and vomiting.  Genitourinary:  Positive for dysuria and vaginal discharge. Negative for flank pain, hematuria and pelvic pain.     Physical Exam Triage Vital Signs ED Triage Vitals [10/15/23 1525]   Encounter Vitals Group     BP 99/66     Systolic BP Percentile      Diastolic BP Percentile      Pulse Rate 89     Resp 19     Temp 98.7 F (37.1 C)     Temp src      SpO2 98 %     Weight      Height      Head Circumference      Peak Flow      Pain Score 6     Pain Loc      Pain Education      Exclude from Growth Chart    No data found.  Updated Vital Signs BP 99/66   Pulse 89   Temp 98.7 F (37.1 C)   Resp 19   SpO2 98%   Visual Acuity Right Eye Distance:   Left Eye Distance:   Bilateral Distance:    Right Eye Near:   Left Eye Near:    Bilateral Near:     Physical Exam Constitutional:      General: She is not in acute distress. HENT:     Mouth/Throat:     Mouth: Mucous membranes are moist.  Cardiovascular:     Rate and Rhythm: Normal rate and regular rhythm.  Pulmonary:     Effort: Pulmonary effort is normal. No respiratory distress.  Abdominal:     General: Bowel sounds are normal.     Palpations: Abdomen is soft.     Tenderness: There is no abdominal tenderness. There is no right CVA tenderness, left CVA tenderness, guarding or rebound.  Genitourinary:    Comments: Patient declines GU exam. Neurological:     Mental Status: She is alert.      UC Treatments / Results  Labs (all labs ordered are listed, but only abnormal results are displayed) Labs Reviewed  POCT URINALYSIS DIP (MANUAL ENTRY) - Abnormal; Notable for the following components:      Result Value   Color, UA straw (*)    All other components within normal limits  POCT URINE PREGNANCY - Normal  CERVICOVAGINAL ANCILLARY ONLY    EKG   Radiology No results found.  Procedures Procedures (including critical care time)  Medications Ordered in UC Medications - No data to display  Initial Impression / Assessment and Plan / UC Course  I have reviewed the triage vital signs and the nursing notes.  Pertinent labs & imaging results that were available during my care of the  patient were reviewed by me and considered in my medical decision making (see chart for  details).    Vaginal discharge, dysuria, generalized abdominal pain.  Afebrile and vital signs are stable.  Abdomen is soft and nontender with good bowel sounds.  Urine does not show signs of infection.  Urine pregnancy negative.  Patient obtained vaginal self swab for testing.  Instructed her to abstain from sexual activity until her test results are back.  Again encouraged patient to schedule an appointment with her primary care provider or gynecologist due to her recurrent symptoms.  ED precautions given.  Education provided on abdominal pain and dysuria.  Final Clinical Impressions(s) / UC Diagnoses   Final diagnoses:  Vaginal discharge  Dysuria  Generalized abdominal pain     Discharge Instructions      Your urine does not show signs of infection.  Urine pregnancy negative.    Your vaginal test are pending.  Do not have sexual activity until all test results are back.    Go to the emergency department if you have worsening symptoms.     ED Prescriptions   None    PDMP not reviewed this encounter.   Mickie Bail, NP 10/15/23 [phone]

## 2023-10-16 LAB — CERVICOVAGINAL ANCILLARY ONLY
Bacterial Vaginitis (gardnerella): POSITIVE — AB
Candida Glabrata: NEGATIVE
Candida Vaginitis: NEGATIVE
Chlamydia: NEGATIVE
Comment: NEGATIVE
Comment: NEGATIVE
Comment: NEGATIVE
Comment: NEGATIVE
Comment: NEGATIVE
Comment: NORMAL
Neisseria Gonorrhea: NEGATIVE
Trichomonas: NEGATIVE

## 2023-10-17 ENCOUNTER — Telehealth: Payer: Self-pay | Admitting: Emergency Medicine

## 2023-10-17 MED ORDER — METRONIDAZOLE 0.75 % VA GEL
1.0000 | Freq: Every day | VAGINAL | 0 refills | Status: AC
Start: 2023-10-17 — End: 2023-10-22

## 2023-10-17 NOTE — Telephone Encounter (Signed)
 Patient called clinic to request medication and treatment for positive BV test from her recent visit.  Reviewed, no callback RN notes yet.  Reviewed with patient, verified pharmacy, prescription sent, per protocol.

## 2023-11-28 ENCOUNTER — Ambulatory Visit

## 2023-11-29 ENCOUNTER — Ambulatory Visit
Admission: RE | Admit: 2023-11-29 | Discharge: 2023-11-29 | Disposition: A | Discharge status: Home / Self Care | Source: Ambulatory Visit | Attending: Physician Assistant | Admitting: Physician Assistant

## 2023-11-29 VITALS — BP 127/75 | HR 94 | Temp 97.9°F | Resp 18

## 2023-11-29 DIAGNOSIS — L732 Hidradenitis suppurativa: Secondary | ICD-10-CM | POA: Diagnosis not present

## 2023-11-29 DIAGNOSIS — N898 Other specified noninflammatory disorders of vagina: Secondary | ICD-10-CM | POA: Diagnosis not present

## 2023-11-29 LAB — POCT URINALYSIS DIP (MANUAL ENTRY)
Bilirubin, UA: NEGATIVE
Blood, UA: NEGATIVE
Glucose, UA: NEGATIVE mg/dL
Ketones, POC UA: NEGATIVE mg/dL
Nitrite, UA: NEGATIVE
Protein Ur, POC: 30 mg/dL — AB
Spec Grav, UA: 1.025 (ref 1.010–1.025)
Urobilinogen, UA: 1 U/dL
pH, UA: 6 (ref 5.0–8.0)

## 2023-11-29 LAB — POCT URINE PREGNANCY: Preg Test, Ur: NEGATIVE

## 2023-11-29 MED ORDER — DOXYCYCLINE HYCLATE 100 MG PO CAPS: 100.0000 mg | ORAL_CAPSULE | Freq: Two times a day (BID) | ORAL | 0 refills | Status: DC

## 2023-11-29 NOTE — ED Provider Notes (Signed)
 Natalie [name]    CSN: [id number] Arrival date & time: 11/29/23  1335      History   Chief Complaint Chief Complaint  Patient presents with   SEXUALLY TRANSMITTED DISEASE    Burning - Entered by patient    HPI Natalie [name] is a 23 y.o. female.   Patient reports that she has hidradenitis suppurativa patient has multiple areas in her groin.  Patient reports that she has areas that are draining.  Patient reports she has had good relief results with doxycycline  in the past.  She has had surgery on bilateral axilla to remove sweat glands.  Patient reports that she has also had some vaginal discharge.  She is requesting STD testing.  Patient requesting to do a vaginal swab and blood work.  Patient denies any fever or chills.     Past Medical History:  Diagnosis Date   Allergy    Anemia affecting pregnancy in third trimester 10/04/2020   Asthma    Cesarean delivery delivered 12/10/2020   Breech presentation   Sciatica of right side 11/07/2020   Tonsillitis    Trichomoniasis     Patient Active Problem List   Diagnosis Date Noted   Depression 03/02/2021   IUD (intrauterine device) in place 12/10/2020   Hidradenitis suppurativa 10/08/2019   Dysmenorrhea 05/02/2015   Asthma, mild intermittent, well-controlled 10/15/2013   Allergic rhinitis 10/15/2013    Past Surgical History:  Procedure Laterality Date   CESAREAN SECTION N/A 12/10/2020   Procedure: CESAREAN SECTION;  Surgeon: Ozan, Jennifer, DO;  Location: [facility] ORS;  Service: Obstetrics;  Laterality: N/A;   HYDRADENITIS EXCISION Bilateral 12/08/2019   Procedure: WIDE EXCISION BILATERAL AXILLARY HIDRADENITIS;  Surgeon: Oza Blumenthal, MD;  Location: [facility] OR;  Service: General;  Laterality: Bilateral;    OB History     Gravida  1   Para      Term      Preterm      AB      Living         SAB      IAB      Ectopic      Multiple      Live Births               Home Medications    Prior to  Admission medications   Medication Sig Start Date End Date Taking? Authorizing Provider  cetirizine  (ZYRTEC ) 10 MG tablet Take 10 mg by mouth daily.    [provider]  fluconazole  (DIFLUCAN ) 150 MG tablet Take one tab PO today and repeat dose in 3 days if symptoms persist Patient not taking: Reported on 06/11/2023 03/13/23   Vernestine Gondola, PA-C  oseltamivir  (TAMIFLU ) 75 MG capsule Take 1 capsule (75 mg total) by mouth every 12 (twelve) hours. Patient not taking: Reported on 10/15/2023 09/17/23   Wellington Half, NP  sertraline  (ZOLOFT ) 100 MG tablet Take 1 tablet (100 mg total) by mouth daily. 04/12/21   Teena Feast, MD  VENTOLIN  HFA 108 [phone] Base) MCG/ACT inhaler SMARTSIG:2 Puff(s) By Mouth Every 6 Hours PRN    [provider]    Family History Family History  Problem Relation Age of Onset   Learning disabilities Mother    Mental illness Mother        Depression   Diabetes Paternal Aunt    Asthma Maternal Grandmother    Hypertension Maternal Grandmother    Learning disabilities Maternal Grandmother    Mental illness  Maternal Grandmother        Depression   Stroke Maternal Grandmother    Hypertension Maternal Grandfather    Alcohol abuse Maternal Grandfather    Drug abuse Maternal Grandfather     Social History Social History   Tobacco Use   Smoking status: Former    Types: Cigars   Smokeless tobacco: Never   Tobacco comments:    last used 03-24-20  Vaping Use   Vaping status: Every Day   Substances: Nicotine  Substance Use Topics   Alcohol use: Yes   Drug use: Not Currently    Types: Marijuana     Allergies   Pollen extract   Review of Systems Review of Systems  All other systems reviewed and are negative.    Physical Exam Triage Vital Signs ED Triage Vitals [11/29/23 1401]  Encounter Vitals Group     BP      Systolic BP Percentile      Diastolic BP Percentile      Pulse      Resp      Temp      Temp src      SpO2      Weight       Height      Head Circumference      Peak Flow      Pain Score 7     Pain Loc      Pain Education      Exclude from Growth Chart    No data found.  Updated Vital Signs There were no vitals taken for this visit.  Visual Acuity Right Eye Distance:   Left Eye Distance:   Bilateral Distance:    Right Eye Near:   Left Eye Near:    Bilateral Near:     Physical Exam Vitals and nursing note reviewed.  Constitutional:      Appearance: She is well-developed.  HENT:     Head: Normocephalic.  Cardiovascular:     Rate and Rhythm: Normal rate.  Pulmonary:     Effort: Pulmonary effort is normal.  Abdominal:     General: There is no distension.  Musculoskeletal:     Cervical back: Normal range of motion.     Comments: Multiple open draining areas bilateral inguinal creases and groin.  Skin:    General: Skin is warm.  Neurological:     General: No focal deficit present.     Mental Status: She is alert and oriented to person, place, and time.      UC Treatments / Results  Labs (all labs ordered are listed, but only abnormal results are displayed) Labs Reviewed  POCT URINALYSIS DIP (MANUAL ENTRY) - Abnormal; Notable for the following components:      Result Value   Protein Ur, POC =30 (*)    Leukocytes, UA Trace (*)    All other components within normal limits  HIV ANTIBODY (ROUTINE TESTING W REFLEX)  RPR  POCT URINE PREGNANCY  CERVICOVAGINAL ANCILLARY ONLY    EKG   Radiology No results found.  Procedures Procedures (including critical care time)  Medications Ordered in UC Medications - No data to display  Initial Impression / Assessment and Plan / UC Course  I have reviewed the triage vital signs and the nursing notes.  Pertinent labs & imaging results that were available during my care of the patient were reviewed by me and considered in my medical decision making (see chart for details).     Labs ordered  and are pending UA is negative.  Patient is  given a prescription for doxycycline  she is advised to return if any problems Final Clinical Impressions(s) / UC Diagnoses   Final diagnoses:  Vaginal discharge     Discharge Instructions      Return if any problems.     ED Prescriptions   None    PDMP not reviewed this encounter. An After Visit Summary was printed and given to the patient.    Sandi Crosby, PA-C 11/29/23 1420

## 2023-11-29 NOTE — Discharge Instructions (Addendum)
 Return if any problems.

## 2023-11-29 NOTE — ED Triage Notes (Signed)
 Patient to Urgent Care with complaints of dysuria/ urinary frequency/ vaginal itching and discharge. Reports symptoms started x1 week ago.  Also requests STD testing w/ blood work.  Reports hx of HS. Reports she currently has multiple areas between her legs/ groin that make it painful to walk. Some areas are draining. Symptoms x1 month.

## 2023-11-30 LAB — RPR: RPR Ser Ql: NONREACTIVE

## 2023-11-30 LAB — HIV ANTIBODY (ROUTINE TESTING W REFLEX): HIV Screen 4th Generation wRfx: NONREACTIVE

## 2023-12-02 ENCOUNTER — Telehealth (HOSPITAL_COMMUNITY): Payer: Self-pay

## 2023-12-02 LAB — CERVICOVAGINAL ANCILLARY ONLY
Bacterial Vaginitis (gardnerella): POSITIVE — AB
Candida Glabrata: NEGATIVE
Candida Vaginitis: NEGATIVE
Chlamydia: NEGATIVE
Comment: NEGATIVE
Comment: NEGATIVE
Comment: NEGATIVE
Comment: NEGATIVE
Comment: NEGATIVE
Comment: NORMAL
Neisseria Gonorrhea: NEGATIVE
Trichomonas: NEGATIVE

## 2023-12-02 MED ORDER — METRONIDAZOLE 0.75 % VA GEL: 1.0000 | Freq: Every day | VAGINAL | 0 refills | Status: AC

## 2023-12-02 MED ORDER — METRONIDAZOLE 500 MG PO TABS: 500.0000 mg | ORAL_TABLET | Freq: Two times a day (BID) | ORAL | 0 refills | Status: DC

## 2023-12-02 NOTE — Addendum Note (Signed)
 Addended by: Sharilyn Davenport on: 12/02/2023 04:05 PM   Modules accepted: Orders

## 2023-12-02 NOTE — Telephone Encounter (Signed)
 Pt requested gel

## 2023-12-02 NOTE — Telephone Encounter (Signed)
 Per protocol, pt requires tx with metronidazole. Rx sent to pharmacy on file.

## 2024-01-10 ENCOUNTER — Ambulatory Visit (INDEPENDENT_AMBULATORY_CARE_PROVIDER_SITE_OTHER): Admitting: Obstetrics and Gynecology

## 2024-01-10 ENCOUNTER — Encounter: Payer: Self-pay | Admitting: Obstetrics and Gynecology

## 2024-01-10 VITALS — BP 124/56 | HR 67 | Ht 61.0 in | Wt 151.1 lb

## 2024-01-10 DIAGNOSIS — Z30431 Encounter for routine checking of intrauterine contraceptive device: Secondary | ICD-10-CM | POA: Diagnosis not present

## 2024-01-10 DIAGNOSIS — R102 Pelvic and perineal pain: Secondary | ICD-10-CM

## 2024-01-10 DIAGNOSIS — Z7689 Persons encountering health services in other specified circumstances: Secondary | ICD-10-CM

## 2024-01-10 NOTE — Progress Notes (Signed)
 HPI:      [name] is a 23 y.o. G1P0 who LMP was No LMP recorded (lmp unknown). (Menstrual status: IUD).  Subjective:   She presents today because she has noticed that she is having unusual pelvic irritability and right-sided pelvic pain.  She also states that her partner feels something poking when they have intercourse. She has an IUD in place since 2022 and it has not been a problem except for the last month. She is unsure of the type of IUD but reports that it is nonhormonal.  She does report that it was inserted at the time of cesarean delivery and at first check the strings were too long and shortened to the appropriate length.     Hx: The following portions of the patient's history were reviewed and updated as appropriate:             She  has a past medical history of Allergy, Anemia affecting pregnancy in third trimester (10/04/2020), Asthma, Cesarean delivery delivered (12/10/2020), Sciatica of right side (11/07/2020), Tonsillitis, and Trichomoniasis. She does not have any pertinent problems on file. She  has a past surgical history that includes Hydradenitis excision (Bilateral, 12/08/2019) and Cesarean section (N/A, 12/10/2020). Her family history includes Alcohol abuse in her maternal grandfather; Asthma in her maternal grandmother; Diabetes in her paternal aunt; Drug abuse in her maternal grandfather; Hypertension in her maternal grandfather and maternal grandmother; Learning disabilities in her maternal grandmother and mother; Mental illness in her maternal grandmother and mother; Stroke in her maternal grandmother. She  reports that she has quit smoking. Her smoking use included cigars. She has never used smokeless tobacco. She reports current alcohol use. She reports that she does not currently use drugs after having used the following drugs: Marijuana. She has a current medication list which includes the following prescription(s): cetirizine , doxycycline , sertraline , ventolin   hfa, fluconazole , and oseltamivir . She is allergic to pollen extract.       Review of Systems:  Review of Systems  Constitutional: Denied constitutional symptoms, night sweats, recent illness, fatigue, fever, insomnia and weight loss.  Eyes: Denied eye symptoms, eye pain, photophobia, vision change and visual disturbance.  Ears/Nose/Throat/Neck: Denied ear, nose, throat or neck symptoms, hearing loss, nasal discharge, sinus congestion and sore throat.  Cardiovascular: Denied cardiovascular symptoms, arrhythmia, chest pain/pressure, edema, exercise intolerance, orthopnea and palpitations.  Respiratory: Denied pulmonary symptoms, asthma, pleuritic pain, productive sputum, cough, dyspnea and wheezing.  Gastrointestinal: Denied, gastro-esophageal reflux, melena, nausea and vomiting.  Genitourinary: Denied genitourinary symptoms including symptomatic vaginal discharge, pelvic relaxation issues, and urinary complaints.  Musculoskeletal: Denied musculoskeletal symptoms, stiffness, swelling, muscle weakness and myalgia.  Dermatologic: Denied dermatology symptoms, rash and scar.  Neurologic: Denied neurology symptoms, dizziness, headache, neck pain and syncope.  Psychiatric: Denied psychiatric symptoms, anxiety and depression.  Endocrine: Denied endocrine symptoms including hot flashes and night sweats.   Meds:   Current Outpatient Medications on File Prior to Visit  Medication Sig Dispense Refill   cetirizine  (ZYRTEC ) 10 MG tablet Take 10 mg by mouth daily.     doxycycline  (VIBRAMYCIN ) 100 MG capsule Take 1 capsule (100 mg total) by mouth 2 (two) times daily. 20 capsule 0   sertraline  (ZOLOFT ) 100 MG tablet Take 1 tablet (100 mg total) by mouth daily. 90 tablet 1   VENTOLIN  HFA 108 (90 Base) MCG/ACT inhaler SMARTSIG:2 Puff(s) By Mouth Every 6 Hours PRN     fluconazole  (DIFLUCAN ) 150 MG tablet Take one tab PO today and repeat dose in  3 days if symptoms persist (Patient not taking: Reported on  06/11/2023) 2 tablet 0   oseltamivir  (TAMIFLU ) 75 MG capsule Take 1 capsule (75 mg total) by mouth every 12 (twelve) hours. (Patient not taking: Reported on 10/15/2023) 10 capsule 0   No current facility-administered medications on file prior to visit.      Objective:     Vitals:   01/10/24 0909  BP: (!) 124/56  Pulse: 67   Filed Weights   01/10/24 0909  Weight: 151 lb 1.6 oz (68.5 kg)              Physical examination   Pelvic:   Vulva: Normal appearance.  No lesions.  Vagina: No lesions or abnormalities noted.  Support: Normal pelvic support.  Urethra No masses tenderness or scarring.  Meatus Normal size without lesions or prolapse.  Cervix: Normal appearance.  No lesions. IUD strings noted at cervical os and much longer than expected.  Strings were shortened to the appropriate length  Anus: Normal exam.  No lesions.  Perineum: Normal exam.  No lesions.        Bimanual   Uterus: Normal size.  Non-tender.  Mobile.  AV.  Adnexae: No masses.  Non-tender to palpation.  Cul-de-sac: Negative for abnormality.             Assessment:    G1P0 Patient Active Problem List   Diagnosis Date Noted   Depression 03/02/2021   IUD (intrauterine device) in place 12/10/2020   Hidradenitis suppurativa 10/08/2019   Dysmenorrhea 05/02/2015   Asthma, mild intermittent, well-controlled 10/15/2013   Allergic rhinitis 10/15/2013     1. Establishing care with new doctor, encounter for   2. Encounter for routine checking of intrauterine contraceptive device (IUD)     Lengthening IUD strings, causing painful intercourse for her partner and generalized pelvic irritability/cramping for her.  It is likely the IUD has shifted to a lower position in the endometrial cavity.   Plan:            1.  Ultrasound for IUD position.  We have discussed the possibility of removal and replacement. Orders No orders of the defined types were placed in this encounter.   No orders of the defined types  were placed in this encounter.     F/U  Return for We will contact her with any abnormal test results.  Delice Felt, M.D. 01/10/2024 9:48 AM

## 2024-01-10 NOTE — Progress Notes (Signed)
 Patient presents today for IUD string check. She reports pain, tingling as well discomfort with intercourse. IUD placed 01/31/21. No other questions or concerns.

## 2024-01-21 ENCOUNTER — Ambulatory Visit

## 2024-01-21 DIAGNOSIS — Z30431 Encounter for routine checking of intrauterine contraceptive device: Secondary | ICD-10-CM | POA: Diagnosis not present

## 2024-01-23 ENCOUNTER — Ambulatory Visit (INDEPENDENT_AMBULATORY_CARE_PROVIDER_SITE_OTHER): Admitting: Obstetrics and Gynecology

## 2024-01-23 ENCOUNTER — Other Ambulatory Visit (HOSPITAL_COMMUNITY)
Admission: RE | Admit: 2024-01-23 | Discharge: 2024-01-23 | Disposition: A | Discharge status: Home / Self Care | Source: Ambulatory Visit | Attending: Obstetrics and Gynecology | Admitting: Obstetrics and Gynecology

## 2024-01-23 ENCOUNTER — Encounter: Payer: Self-pay | Admitting: Obstetrics and Gynecology

## 2024-01-23 VITALS — BP 103/69 | HR 105 | Ht 61.0 in | Wt 149.8 lb

## 2024-01-23 DIAGNOSIS — Z124 Encounter for screening for malignant neoplasm of cervix: Secondary | ICD-10-CM | POA: Insufficient documentation

## 2024-01-23 DIAGNOSIS — Z113 Encounter for screening for infections with a predominantly sexual mode of transmission: Secondary | ICD-10-CM | POA: Insufficient documentation

## 2024-01-23 DIAGNOSIS — Z30433 Encounter for removal and reinsertion of intrauterine contraceptive device: Secondary | ICD-10-CM

## 2024-01-23 DIAGNOSIS — Z30432 Encounter for removal of intrauterine contraceptive device: Secondary | ICD-10-CM

## 2024-01-23 MED ORDER — LEVONORGESTREL 20 MCG/DAY IU IUD
1.0000 | INTRAUTERINE_SYSTEM | Freq: Once | INTRAUTERINE | Status: AC
  Administered 2024-01-23: 1 via INTRAUTERINE

## 2024-01-23 NOTE — Progress Notes (Signed)
 HPI:      [name] is a 23 y.o. G1P0 who LMP was No LMP recorded (lmp unknown). (Menstrual status: IUD).  Subjective:   She presents today for IUD insertion.  Her IUD has been found to be too low by ultrasound.  She would like it removed and replaced.    Hx: The following portions of the patient's history were reviewed and updated as appropriate:             She  has a past medical history of Allergy, Anemia affecting pregnancy in third trimester (10/04/2020), Asthma, Cesarean delivery delivered (12/10/2020), Sciatica of right side (11/07/2020), Tonsillitis, and Trichomoniasis. She does not have any pertinent problems on file. She  has a past surgical history that includes Hydradenitis excision (Bilateral, 12/08/2019) and Cesarean section (N/A, 12/10/2020). Her family history includes Alcohol abuse in her maternal grandfather; Asthma in her maternal grandmother; Diabetes in her paternal aunt; Drug abuse in her maternal grandfather; Hypertension in her maternal grandfather and maternal grandmother; Learning disabilities in her maternal grandmother and mother; Mental illness in her maternal grandmother and mother; Stroke in her maternal grandmother. She  reports that she has quit smoking. Her smoking use included cigars. She has never used smokeless tobacco. She reports current alcohol use. She reports that she does not currently use drugs after having used the following drugs: Marijuana. She has a current medication list which includes the following prescription(s): cetirizine , doxycycline , sertraline , and ventolin  hfa, and the following Facility-Administered Medications: levonorgestrel . She is allergic to pollen extract.       Review of Systems:  Review of Systems  Constitutional: Denied constitutional symptoms, night sweats, recent illness, fatigue, fever, insomnia and weight loss.  Eyes: Denied eye symptoms, eye pain, photophobia, vision change and visual disturbance.   Ears/Nose/Throat/Neck: Denied ear, nose, throat or neck symptoms, hearing loss, nasal discharge, sinus congestion and sore throat.  Cardiovascular: Denied cardiovascular symptoms, arrhythmia, chest pain/pressure, edema, exercise intolerance, orthopnea and palpitations.  Respiratory: Denied pulmonary symptoms, asthma, pleuritic pain, productive sputum, cough, dyspnea and wheezing.  Gastrointestinal: Denied, gastro-esophageal reflux, melena, nausea and vomiting.  Genitourinary: Denied genitourinary symptoms including symptomatic vaginal discharge, pelvic relaxation issues, and urinary complaints.  Musculoskeletal: Denied musculoskeletal symptoms, stiffness, swelling, muscle weakness and myalgia.  Dermatologic: Denied dermatology symptoms, rash and scar.  Neurologic: Denied neurology symptoms, dizziness, headache, neck pain and syncope.  Psychiatric: Denied psychiatric symptoms, anxiety and depression.  Endocrine: Denied endocrine symptoms including hot flashes and night sweats.   Meds:   Current Outpatient Medications on File Prior to Visit  Medication Sig Dispense Refill   cetirizine  (ZYRTEC ) 10 MG tablet Take 10 mg by mouth daily.     doxycycline  (VIBRAMYCIN ) 100 MG capsule Take 1 capsule (100 mg total) by mouth 2 (two) times daily. 20 capsule 0   sertraline  (ZOLOFT ) 100 MG tablet Take 1 tablet (100 mg total) by mouth daily. 90 tablet 1   VENTOLIN  HFA 108 (90 Base) MCG/ACT inhaler SMARTSIG:2 Puff(s) By Mouth Every 6 Hours PRN     No current facility-administered medications on file prior to visit.    Objective:     Vitals:   01/23/24 1128  BP: 103/69  Pulse: (!) 105    Physical examination   Pelvic:   Vulva: Normal appearance.  No lesions.  Vagina: No lesions or abnormalities noted.  Support: Normal pelvic support.  Urethra No masses tenderness or scarring.  Meatus Normal size without lesions or prolapse.  Cervix: Normal appearance.  No lesions.  Anus: Normal exam.  No  lesions.  Perineum: Normal exam.  No lesions.        Bimanual   Uterus: Normal size.  Non-tender.  Mobile.  AV.  Adnexae: No masses.  Non-tender to palpation.  Cul-de-sac: Negative for abnormality.   IUD Removal Strings of IUD identified and grasped.  IUD removed without problem.  Pt tolerated this well.  IUD noted to be intact.    IUD Procedure Pt has read the booklet and signed the appropriate forms regarding the Mirena  IUD.  All of her questions have been answered.   The cervix was cleansed with betadine solution.  After sounding the uterus and noting the position, the IUD was placed in the usual manner without problem.  The string was cut to the appropriate length.  The patient tolerated the procedure well.            [facility] # = [id number]   Assessment:    G1P0 Patient Active Problem List   Diagnosis Date Noted   Depression 03/02/2021   IUD (intrauterine device) in place 12/10/2020   Hidradenitis suppurativa 10/08/2019   Dysmenorrhea 05/02/2015   Asthma, mild intermittent, well-controlled 10/15/2013   Allergic rhinitis 10/15/2013     1. Encounter for IUD removal and reinsertion   2. Cervical cancer screening   3. Screening for STD (sexually transmitted disease)       Plan:          1.  Pap performed   F/U  Return in about 4 weeks (around 02/20/2024).  Alm DOROTHA Sar, M.D. 01/23/2024 11:42 AM

## 2024-01-23 NOTE — Progress Notes (Signed)
 Patient presents today for IUD removal and reinsertion. Pap smear is due, completed today.

## 2024-01-28 ENCOUNTER — Ambulatory Visit (HOSPITAL_COMMUNITY): Admission: EM | Admit: 2024-01-28 | Discharge: 2024-01-28 | Disposition: A | Discharge status: Home / Self Care

## 2024-01-28 ENCOUNTER — Encounter (HOSPITAL_COMMUNITY): Payer: Self-pay | Admitting: Emergency Medicine

## 2024-01-28 DIAGNOSIS — N760 Acute vaginitis: Secondary | ICD-10-CM | POA: Insufficient documentation

## 2024-01-28 DIAGNOSIS — Z3202 Encounter for pregnancy test, result negative: Secondary | ICD-10-CM | POA: Diagnosis not present

## 2024-01-28 LAB — POCT URINALYSIS DIP (MANUAL ENTRY)
Bilirubin, UA: NEGATIVE
Blood, UA: NEGATIVE
Glucose, UA: NEGATIVE mg/dL
Ketones, POC UA: NEGATIVE mg/dL
Nitrite, UA: NEGATIVE
Protein Ur, POC: NEGATIVE mg/dL
Spec Grav, UA: >=1.03 — AB (ref 1.010–1.025)
Urobilinogen, UA: 0.2 U/dL
pH, UA: 5.5 (ref 5.0–8.0)

## 2024-01-28 LAB — CYTOLOGY - PAP
Chlamydia: NEGATIVE
Comment: NEGATIVE
Comment: NEGATIVE
Comment: NORMAL
High risk HPV: POSITIVE — AB
Neisseria Gonorrhea: NEGATIVE

## 2024-01-28 LAB — POCT URINE PREGNANCY: Preg Test, Ur: NEGATIVE

## 2024-01-28 NOTE — ED Triage Notes (Signed)
 Pt reports vaginal itching been minor for 2 days. Reports this morning my pee smelt like amoxicillin . Denies dysuria, known exposure to STD or vaginal discharge.

## 2024-01-28 NOTE — Discharge Instructions (Addendum)
  1. Acute vaginitis (Primary) - POC urinalysis dipstick completed in UC shows trace leukocytes, no nitrite, no blood, no sign of urinary tract infection - POCT urine pregnancy completed UC is negative. - Cervicovaginal swab collected in UC and sent to lab for further testing results should be available in 2 to 3 days.  If there are any abnormal findings patient will be contacted and appropriate treatment provided. -Continue to monitor symptoms for any change in severity if there is any escalation of current symptoms or development of new symptoms follow-up in ER for further evaluation and management.

## 2024-01-28 NOTE — ED Provider Notes (Signed)
 [facility]  Note:  This document was prepared using Dragon voice recognition software and may include unintentional dictation errors.  MRN: [id number] DOB: [birth year]  Subjective:   Natalie [name] is a 23 y.o. female presenting for minor vaginal itching x 2 days.  Patient reports was a mild odor as well as a morning.  Patient reports that itching is similar to previous bouts of BV.  Patient has had BV and yeast infection in the past.  Patient denies any known exposure to STDs and has no vaginal discharge or vaginal lesions.  Patient denies dysuria, increased urinary frequency, or flank pain.  Patient states that she has an IUD in place and has had not had a period since having IUD replaced 2 to 3 months ago.  No current facility-administered medications for this encounter.  Current Outpatient Medications:    cetirizine  (ZYRTEC ) 10 MG tablet, Take 10 mg by mouth daily., Disp: , Rfl:    doxycycline  (VIBRAMYCIN ) 100 MG capsule, Take 1 capsule (100 mg total) by mouth 2 (two) times daily., Disp: 20 capsule, Rfl: 0   sertraline  (ZOLOFT ) 100 MG tablet, Take 1 tablet (100 mg total) by mouth daily., Disp: 90 tablet, Rfl: 1   VENTOLIN  HFA 108 (90 Base) MCG/ACT inhaler, SMARTSIG:2 Puff(s) By Mouth Every 6 Hours PRN, Disp: , Rfl:    Allergies  Allergen Reactions   Pollen Extract Other (See Comments)    Seasonal allergies     Past Medical History:  Diagnosis Date   Allergy    Anemia affecting pregnancy in third trimester 10/04/2020   Asthma    Cesarean delivery delivered 12/10/2020   Breech presentation   Sciatica of right side 11/07/2020   Tonsillitis    Trichomoniasis      Past Surgical History:  Procedure Laterality Date   CESAREAN SECTION N/A 12/10/2020   Procedure: CESAREAN SECTION;  Surgeon: Ozan, Jennifer, DO;  Location: [facility];  Service: Obstetrics;  Laterality: N/A;   HYDRADENITIS EXCISION Bilateral 12/08/2019   Procedure: WIDE EXCISION BILATERAL AXILLARY  HIDRADENITIS;  Surgeon: Vernetta Berg, MD;  Location: [facility] OR;  Service: General;  Laterality: Bilateral;    Family History  Problem Relation Age of Onset   Learning disabilities Mother    Mental illness Mother        Depression   Diabetes Paternal Aunt    Asthma Maternal Grandmother    Hypertension Maternal Grandmother    Learning disabilities Maternal Grandmother    Mental illness Maternal Grandmother        Depression   Stroke Maternal Grandmother    Hypertension Maternal Grandfather    Alcohol abuse Maternal Grandfather    Drug abuse Maternal Grandfather     Social History   Tobacco Use   Smoking status: Former    Types: Cigars   Smokeless tobacco: Never   Tobacco comments:    last used 03-24-20  Vaping Use   Vaping status: Every Day   Substances: Nicotine  Substance Use Topics   Alcohol use: Yes   Drug use: Not Currently    Types: Marijuana    ROS Refer to HPI for ROS details.  Objective:   Vitals: BP 113/77 (BP Location: Right Arm)   Pulse 83   Temp 98.8 F (37.1 C) (Oral)   Resp 15   LMP  (LMP Unknown)   SpO2 98%   Physical Exam Vitals and nursing note reviewed.  Constitutional:      General: She is not in acute distress.  Appearance: She is well-developed. She is not ill-appearing or toxic-appearing.  HENT:     Head: Normocephalic and atraumatic.   Cardiovascular:     Rate and Rhythm: Normal rate.  Pulmonary:     Effort: Pulmonary effort is normal. No respiratory distress.  Abdominal:     General: There is no distension.     Tenderness: There is no abdominal tenderness. There is right CVA tenderness. There is no left CVA tenderness, guarding or rebound.  Genitourinary:    Vagina: No vaginal discharge.   Skin:    General: Skin is warm and dry.   Neurological:     General: No focal deficit present.     Mental Status: She is alert and oriented to person, place, and time.   Psychiatric:        Mood and Affect: Mood normal.         Behavior: Behavior normal.     Procedures  Results for orders placed or performed during the hospital encounter of 01/28/24 (from the past 24 hours)  POC urinalysis dipstick     Status: Abnormal   Collection Time: 01/28/24  8:31 AM  Result Value Ref Range   Color, UA yellow yellow   Clarity, UA cloudy (A) clear   Glucose, UA negative negative mg/dL   Bilirubin, UA negative negative   Ketones, POC UA negative negative mg/dL   Spec Grav, UA >=8.969 (A) 1.010 - 1.025   Blood, UA negative negative   pH, UA 5.5 5.0 - 8.0   Protein Ur, POC negative negative mg/dL   Urobilinogen, UA 0.2 0.2 or 1.0 E.U./dL   Nitrite, UA Negative Negative   Leukocytes, UA Trace (A) Negative  POCT urine pregnancy     Status: None   Collection Time: 01/28/24  8:31 AM  Result Value Ref Range   Preg Test, Ur Negative Negative    No results found.   Assessment and Plan :     Discharge Instructions       1. Acute vaginitis (Primary) - POC urinalysis dipstick completed in UC shows trace leukocytes, no nitrite, no blood, no sign of urinary tract infection - POCT urine pregnancy completed UC is negative. - Cervicovaginal swab collected in UC and sent to lab for further testing results should be available in 2 to 3 days.  If there are any abnormal findings patient will be contacted and appropriate treatment provided. -Continue to monitor symptoms for any change in severity if there is any escalation of current symptoms or development of new symptoms follow-up in ER for further evaluation and management.      Tirso Laws B Macaria Bias   Nikolos Billig, [place] B, [place] 01/28/24 [phone]

## 2024-01-30 ENCOUNTER — Ambulatory Visit (HOSPITAL_COMMUNITY): Payer: Self-pay

## 2024-01-30 LAB — CERVICOVAGINAL ANCILLARY ONLY
Bacterial Vaginitis (gardnerella): POSITIVE — AB
Candida Glabrata: NEGATIVE
Candida Vaginitis: NEGATIVE
Chlamydia: NEGATIVE
Comment: NEGATIVE
Comment: NEGATIVE
Comment: NEGATIVE
Comment: NEGATIVE
Comment: NEGATIVE
Comment: NORMAL
Neisseria Gonorrhea: NEGATIVE
Trichomonas: POSITIVE — AB

## 2024-01-30 MED ORDER — METRONIDAZOLE 500 MG PO TABS: 500.0000 mg | ORAL_TABLET | Freq: Two times a day (BID) | ORAL | 0 refills | Status: AC

## 2024-02-07 ENCOUNTER — Ambulatory Visit: Payer: Self-pay

## 2024-02-13 ENCOUNTER — Ambulatory Visit: Payer: Self-pay

## 2024-02-25 ENCOUNTER — Ambulatory Visit

## 2024-02-25 ENCOUNTER — Encounter: Payer: Self-pay | Admitting: Emergency Medicine

## 2024-02-25 ENCOUNTER — Ambulatory Visit: Admitting: Obstetrics and Gynecology

## 2024-02-25 ENCOUNTER — Ambulatory Visit
Admission: EM | Admit: 2024-02-25 | Discharge: 2024-02-25 | Disposition: A | Discharge status: Home / Self Care | Attending: Emergency Medicine | Admitting: Emergency Medicine

## 2024-02-25 DIAGNOSIS — R300 Dysuria: Secondary | ICD-10-CM | POA: Diagnosis not present

## 2024-02-25 DIAGNOSIS — N3000 Acute cystitis without hematuria: Secondary | ICD-10-CM | POA: Diagnosis not present

## 2024-02-25 DIAGNOSIS — Z30431 Encounter for routine checking of intrauterine contraceptive device: Secondary | ICD-10-CM

## 2024-02-25 LAB — POCT URINE DIPSTICK
Bilirubin, UA: NEGATIVE
Glucose, UA: NEGATIVE mg/dL
Ketones, POC UA: NEGATIVE mg/dL
Nitrite, UA: POSITIVE — AB
POC PROTEIN,UA: 100 — AB
Spec Grav, UA: >=1.03 — AB (ref 1.010–1.025)
Urobilinogen, UA: 0.2 U/dL
pH, UA: 6.5 (ref 5.0–8.0)

## 2024-02-25 MED ORDER — FLUCONAZOLE 150 MG PO TABS: ORAL_TABLET | ORAL | 0 refills | Status: DC

## 2024-02-25 MED ORDER — NITROFURANTOIN MONOHYD MACRO 100 MG PO CAPS: 100.0000 mg | ORAL_CAPSULE | Freq: Two times a day (BID) | ORAL | 0 refills | Status: DC

## 2024-02-25 NOTE — Discharge Instructions (Addendum)
 Your urinalysis shows Natalie [name] blood cells and nitrates which are indicative of infection, your urine will be sent to the lab to determine exactly which bacteria is present, if any changes need to be made to your medications you will be notified  Begin use of Macrobid  twice daily for 5 days  Will empirically treat you for yeast, take 1 Diflucan  tablet today for after completion of all antibiotic  Vaginal swab checking for yeast, bacterial vaginosis, gonorrhea chlamydia and trichomoniasis pending, you will be notified of results via telephone and additional treatment sent to pharmacy  Please refrain from any form of sexual intercourse until all labs have returned, symptoms have resolved and treatments are complete  You may use over-the-counter Azo to help minimize your symptoms until antibiotic removes bacteria, this medication will turn your urine orange  Increase your fluid intake through use of water   As always practice good hygiene, wiping front to back and avoidance of scented vaginal products to prevent further irritation  If symptoms continue to persist after use of medication or recur please follow-up with urgent care or your primary doctor as needed

## 2024-02-25 NOTE — ED Provider Notes (Signed)
 Natalie [name]    CSN: [id number] Arrival date & time: 02/25/24  1144      History   Chief Complaint Chief Complaint  Patient presents with   Vaginal Itching   Dysuria    HPI Natalie [name] is a 23 y.o. female.   Patient presents for evaluation of dysuria, urinary frequency, vaginal itching and odor present for 3 days.  Associated lower abdominal discomfort described as feeling bloated.  Has not attempted treatment.  Denies pain, fever, hematuria, vaginal discharge.  Has IUD placed, endorses spotting at baseline.  Past Medical History:  Diagnosis Date   Allergy    Anemia affecting pregnancy in third trimester 10/04/2020   Asthma    Cesarean delivery delivered 12/10/2020   Breech presentation   Sciatica of right side 11/07/2020   Tonsillitis    Trichomoniasis     Patient Active Problem List   Diagnosis Date Noted   Depression 03/02/2021   IUD (intrauterine device) in place 12/10/2020   Hidradenitis suppurativa 10/08/2019   Dysmenorrhea 05/02/2015   Asthma, mild intermittent, well-controlled 10/15/2013   Allergic rhinitis 10/15/2013    Past Surgical History:  Procedure Laterality Date   CESAREAN SECTION N/A 12/10/2020   Procedure: CESAREAN SECTION;  Surgeon: Ozan, Jennifer, DO;  Location: [facility];  Service: Obstetrics;  Laterality: N/A;   HYDRADENITIS EXCISION Bilateral 12/08/2019   Procedure: WIDE EXCISION BILATERAL AXILLARY HIDRADENITIS;  Surgeon: Vernetta Berg, MD;  Location: [facility] OR;  Service: General;  Laterality: Bilateral;    OB History     Gravida  1   Para      Term      Preterm      AB      Living         SAB      IAB      Ectopic      Multiple      Live Births               Home Medications    Prior to Admission medications   Medication Sig Start Date End Date Taking? Authorizing Provider  cetirizine  (ZYRTEC ) 10 MG tablet Take 10 mg by mouth daily.    [provider]  doxycycline  (VIBRAMYCIN ) 100 MG  capsule Take 1 capsule (100 mg total) by mouth 2 (two) times daily. 11/29/23   Sofia, Leslie K, PA-C  fluconazole  (DIFLUCAN ) 150 MG tablet Take 1 tablet today then take second dose in 5 days 02/25/24  Yes Seini Lannom R, NP  nitrofurantoin , macrocrystal-monohydrate, (MACROBID ) 100 MG capsule Take 1 capsule (100 mg total) by mouth 2 (two) times daily. 02/25/24  Yes Bitania Shankland R, NP  sertraline  (ZOLOFT ) 100 MG tablet Take 1 tablet (100 mg total) by mouth daily. 04/12/21   Lola Donnice HERO, MD  VENTOLIN  HFA 108 (90 Base) MCG/ACT inhaler SMARTSIG:2 Puff(s) By Mouth Every 6 Hours PRN    [provider]    Family History Family History  Problem Relation Age of Onset   Learning disabilities Mother    Mental illness Mother        Depression   Diabetes Paternal Aunt    Asthma Maternal Grandmother    Hypertension Maternal Grandmother    Learning disabilities Maternal Grandmother    Mental illness Maternal Grandmother        Depression   Stroke Maternal Grandmother    Hypertension Maternal Grandfather    Alcohol abuse Maternal Grandfather    Drug abuse Maternal Grandfather  Social History Social History   Tobacco Use   Smoking status: Former    Types: Cigars   Smokeless tobacco: Never   Tobacco comments:    last used 03-24-20  Vaping Use   Vaping status: Every Day   Substances: Nicotine  Substance Use Topics   Alcohol use: Yes   Drug use: Not Currently    Types: Marijuana     Allergies   Pollen extract   Review of Systems Review of Systems   Physical Exam Triage Vital Signs ED Triage Vitals  Encounter Vitals Group     BP 02/25/24 1232 103/63     Girls Systolic BP Percentile --      Girls Diastolic BP Percentile --      Boys Systolic BP Percentile --      Boys Diastolic BP Percentile --      Pulse Rate 02/25/24 1232 60     Resp 02/25/24 1232 16     Temp 02/25/24 1232 98.3 F (36.8 C)     Temp Source 02/25/24 1232 Oral     SpO2 02/25/24 1232 100 %      Weight --      Height --      Head Circumference --      Peak Flow --      Pain Score 02/25/24 1236 0     Pain Loc --      Pain Education --      Exclude from Growth Chart --    No data found.  Updated Vital Signs BP 103/63 (BP Location: Left Arm)   Pulse 60   Temp 98.3 F (36.8 C) (Oral)   Resp 16   LMP 01/05/2024 (Approximate)   SpO2 100%   Visual Acuity Right Eye Distance:   Left Eye Distance:   Bilateral Distance:    Right Eye Near:   Left Eye Near:    Bilateral Near:     Physical Exam Constitutional:      Appearance: Normal appearance.  Eyes:     Extraocular Movements: Extraocular movements intact.  Pulmonary:     Effort: Pulmonary effort is normal.  Abdominal:     General: Abdomen is flat. Bowel sounds are normal.     Tenderness: There is abdominal tenderness in the suprapubic area.  Genitourinary:    Comments: deferred Neurological:     Mental Status: She is alert and oriented to person, place, and time. Mental status is at baseline.      UC Treatments / Results  Labs (all labs ordered are listed, but only abnormal results are displayed) Labs Reviewed  POCT URINE DIPSTICK - Abnormal; Notable for the following components:      Result Value   Clarity, UA cloudy (*)    Spec Grav, UA >=1.030 (*)    Blood, UA moderate (*)    POC PROTEIN,UA =100 (*)    Nitrite, UA Positive (*)    Leukocytes, UA Trace (*)    All other components within normal limits  URINE CULTURE  CERVICOVAGINAL ANCILLARY ONLY    EKG   Radiology No results found.  Procedures Procedures (including critical care time)  Medications Ordered in UC Medications - No data to display  Initial Impression / Assessment and Plan / UC Course  I have reviewed the triage vital signs and the nursing notes.  Pertinent labs & imaging results that were available during my care of the patient were reviewed by me and considered in my medical decision making (see chart  for  details).  Acute Cystitis without hematuria, dysuria  Suprapubic tenderness noted on exam, not guarding, no signs of sepsis, stable, recent antibiotic use for treatment of trichomoniasis and bacterial vaginosis, completed as directed, urinalysis showing leukocytes and nitrates, sent for culture, prescribed Macrobid  for treatment, empirically placed on Diflucan  for treatment of possible yeast, discussed administration, STI labs pending, will treat per protocol, advised abstaining until lab results, treatment is complete and all symptoms have resolved Final Clinical Impressions(s) / UC Diagnoses   Final diagnoses:  Dysuria  Acute cystitis without hematuria     Discharge Instructions      Your urinalysis shows Jermane Brayboy blood cells and nitrates which are indicative of infection, your urine will be sent to the lab to determine exactly which bacteria is present, if any changes need to be made to your medications you will be notified  Begin use of Macrobid  twice daily for 5 days  Will empirically treat you for yeast, take 1 Diflucan  tablet today for after completion of all antibiotic  Vaginal swab checking for yeast, bacterial vaginosis, gonorrhea chlamydia and trichomoniasis pending, you will be notified of results via telephone and additional treatment sent to pharmacy  Please refrain from any form of sexual intercourse until all labs have returned, symptoms have resolved and treatments are complete  You may use over-the-counter Azo to help minimize your symptoms until antibiotic removes bacteria, this medication will turn your urine orange  Increase your fluid intake through use of water   As always practice good hygiene, wiping front to back and avoidance of scented vaginal products to prevent further irritation  If symptoms continue to persist after use of medication or recur please follow-up with urgent care or your primary doctor as needed    ED Prescriptions     Medication Sig  Dispense Auth. Provider   nitrofurantoin , macrocrystal-monohydrate, (MACROBID ) 100 MG capsule Take 1 capsule (100 mg total) by mouth 2 (two) times daily. 10 capsule Zethan Alfieri R, NP   fluconazole  (DIFLUCAN ) 150 MG tablet Take 1 tablet today then take second dose in 5 days 2 tablet Malike Foglio, Shelba SAUNDERS, NP      PDMP not reviewed this encounter.   Teresa Shelba SAUNDERS, NP 02/25/24 1257

## 2024-02-25 NOTE — ED Triage Notes (Signed)
 Patient reports painful urination and vaginal itching x 2 days. Vaginal odor. Denies vaginal discharged. Patient denies using anything for symptoms.

## 2024-02-26 LAB — CERVICOVAGINAL ANCILLARY ONLY
Bacterial Vaginitis (gardnerella): NEGATIVE
Candida Glabrata: NEGATIVE
Candida Vaginitis: POSITIVE — AB
Chlamydia: NEGATIVE
Comment: NEGATIVE
Comment: NEGATIVE
Comment: NEGATIVE
Comment: NEGATIVE
Comment: NEGATIVE
Comment: NORMAL
Neisseria Gonorrhea: NEGATIVE
Trichomonas: NEGATIVE

## 2024-02-27 ENCOUNTER — Ambulatory Visit: Payer: Self-pay

## 2024-02-27 LAB — URINE CULTURE: Culture: >=100000 — AB

## 2024-03-07 ENCOUNTER — Encounter (HOSPITAL_COMMUNITY): Payer: Self-pay | Admitting: Emergency Medicine

## 2024-03-07 ENCOUNTER — Ambulatory Visit (HOSPITAL_COMMUNITY)
Admission: EM | Admit: 2024-03-07 | Discharge: 2024-03-07 | Disposition: A | Discharge status: Home / Self Care | Attending: Internal Medicine | Admitting: Internal Medicine

## 2024-03-07 DIAGNOSIS — N898 Other specified noninflammatory disorders of vagina: Secondary | ICD-10-CM | POA: Insufficient documentation

## 2024-03-07 DIAGNOSIS — Z3202 Encounter for pregnancy test, result negative: Secondary | ICD-10-CM | POA: Diagnosis not present

## 2024-03-07 DIAGNOSIS — L089 Local infection of the skin and subcutaneous tissue, unspecified: Secondary | ICD-10-CM | POA: Insufficient documentation

## 2024-03-07 DIAGNOSIS — S31135A Puncture wound of abdominal wall without foreign body, periumbilic region without penetration into peritoneal cavity, initial encounter: Secondary | ICD-10-CM | POA: Insufficient documentation

## 2024-03-07 DIAGNOSIS — R102 Pelvic and perineal pain: Secondary | ICD-10-CM | POA: Diagnosis not present

## 2024-03-07 LAB — POCT URINE PREGNANCY: Preg Test, Ur: NEGATIVE

## 2024-03-07 LAB — POCT URINALYSIS DIP (MANUAL ENTRY)
Bilirubin, UA: NEGATIVE
Glucose, UA: NEGATIVE mg/dL
Leukocytes, UA: NEGATIVE
Nitrite, UA: NEGATIVE
Protein Ur, POC: NEGATIVE mg/dL
Spec Grav, UA: 1.025 (ref 1.010–1.025)
Urobilinogen, UA: 0.2 U/dL
pH, UA: 5.5 (ref 5.0–8.0)

## 2024-03-07 MED ORDER — MUPIROCIN 2 % EX OINT: 1.0000 | TOPICAL_OINTMENT | Freq: Two times a day (BID) | CUTANEOUS | 1 refills | Status: DC

## 2024-03-07 NOTE — ED Triage Notes (Signed)
 Pt c/o vaginal itching and discharge x's 2 days  St's she was seen for same on 7/29 and dx. With UTI was given antibiotics

## 2024-03-07 NOTE — ED Provider Notes (Signed)
 [facility]    CSN: [id number] Arrival date & time: 03/07/24  1036      History   Chief Complaint Chief Complaint  Patient presents with   Vaginal itching    HPI Natalie [name] is a 23 y.o. female.   23 year old female who presents urgent care with complaints of some lower abdominal pain, vaginal itching and vaginal discharge.  She reports that she was seen on July 29 and diagnosed with urinary tract infection as well as a yeast infection.  She was treated with Diflucan  and Macrobid .  Her symptoms did get better but then she did have unprotected sex and reports that she has had a recurrence of her symptoms in the last 2 to 3 days.  She reports that she has some difficulty with urinating but denies dysuria, hematuria, fevers, chills, nausea, vomiting.  She also relates that she has been having some redness and itching around her bellybutton piercing.  The piercing has been present for about 2 years but she did recently change to a different type of the piercing.     Past Medical History:  Diagnosis Date   Allergy    Anemia affecting pregnancy in third trimester 10/04/2020   Asthma    Cesarean delivery delivered 12/10/2020   Breech presentation   Sciatica of right side 11/07/2020   Tonsillitis    Trichomoniasis     Patient Active Problem List   Diagnosis Date Noted   Depression 03/02/2021   IUD (intrauterine device) in place 12/10/2020   Hidradenitis suppurativa 10/08/2019   Dysmenorrhea 05/02/2015   Asthma, mild intermittent, well-controlled 10/15/2013   Allergic rhinitis 10/15/2013    Past Surgical History:  Procedure Laterality Date   CESAREAN SECTION N/A 12/10/2020   Procedure: CESAREAN SECTION;  Surgeon: Ozan, Jennifer, DO;  Location: [facility];  Service: Obstetrics;  Laterality: N/A;   HYDRADENITIS EXCISION Bilateral 12/08/2019   Procedure: WIDE EXCISION BILATERAL AXILLARY HIDRADENITIS;  Surgeon: Vernetta Berg, MD;  Location: [facility];  Service: General;   Laterality: Bilateral;    OB History     Gravida  1   Para      Term      Preterm      AB      Living         SAB      IAB      Ectopic      Multiple      Live Births               Home Medications    Prior to Admission medications   Medication Sig Start Date End Date Taking? Authorizing Provider  mupirocin  ointment (BACTROBAN ) 2 % Apply 1 Application topically 2 (two) times daily. 03/07/24  Yes Remmy Crass A, PA-C  cetirizine  (ZYRTEC ) 10 MG tablet Take 10 mg by mouth daily.    [provider]  doxycycline  (VIBRAMYCIN ) 100 MG capsule Take 1 capsule (100 mg total) by mouth 2 (two) times daily. 11/29/23   Sofia, Leslie K, PA-C  fluconazole  (DIFLUCAN ) 150 MG tablet Take 1 tablet today then take second dose in 5 days Patient not taking: Reported on 03/07/2024 02/25/24   Teresa Shelba SAUNDERS, NP  nitrofurantoin , macrocrystal-monohydrate, (MACROBID ) 100 MG capsule Take 1 capsule (100 mg total) by mouth 2 (two) times daily. 02/25/24   Cherae Marton, Shelba SAUNDERS, NP  sertraline  (ZOLOFT ) 100 MG tablet Take 1 tablet (100 mg total) by mouth daily. 04/12/21   Lola Donnice HERO, MD  VENTOLIN  HFA  108 (90 Base) MCG/ACT inhaler SMARTSIG:2 Puff(s) By Mouth Every 6 Hours PRN    [provider]    Family History Family History  Problem Relation Age of Onset   Learning disabilities Mother    Mental illness Mother        Depression   Diabetes Paternal Aunt    Asthma Maternal Grandmother    Hypertension Maternal Grandmother    Learning disabilities Maternal Grandmother    Mental illness Maternal Grandmother        Depression   Stroke Maternal Grandmother    Hypertension Maternal Grandfather    Alcohol abuse Maternal Grandfather    Drug abuse Maternal Grandfather     Social History Social History   Tobacco Use   Smoking status: Former    Types: Cigars   Smokeless tobacco: Never   Tobacco comments:    last used 03-24-20  Vaping Use   Vaping status: Every Day    Substances: Nicotine  Substance Use Topics   Alcohol use: Yes   Drug use: Not Currently    Types: Marijuana     Allergies   Pollen extract   Review of Systems Review of Systems  Constitutional:  Negative for chills and fever.  HENT:  Negative for ear pain and sore throat.   Eyes:  Negative for pain and visual disturbance.  Respiratory:  Negative for cough and shortness of breath.   Cardiovascular:  Negative for chest pain and palpitations.  Gastrointestinal:  Positive for abdominal pain (Suprapubic). Negative for vomiting.  Genitourinary:  Positive for difficulty urinating (Mild) and vaginal discharge. Negative for dysuria and hematuria.       Vaginal itching  Musculoskeletal:  Negative for arthralgias and back pain.  Skin:  Negative for color change and rash.       Redness around bellybutton piercing with itching  Neurological:  Negative for seizures and syncope.  All other systems reviewed and are negative.    Physical Exam Triage Vital Signs ED Triage Vitals [03/07/24 1059]  Encounter Vitals Group     BP 109/78     Girls Systolic BP Percentile      Girls Diastolic BP Percentile      Boys Systolic BP Percentile      Boys Diastolic BP Percentile      Pulse Rate 76     Resp 16     Temp 98.1 F (36.7 C)     Temp Source Oral     SpO2      Weight      Height      Head Circumference      Peak Flow      Pain Score 0     Pain Loc      Pain Education      Exclude from Growth Chart    No data found.  Updated Vital Signs BP 109/78 (BP Location: Right Arm)   Pulse 76   Temp 98.1 F (36.7 C) (Oral)   Resp 16   LMP 01/05/2024 (Approximate)   Visual Acuity Right Eye Distance:   Left Eye Distance:   Bilateral Distance:    Right Eye Near:   Left Eye Near:    Bilateral Near:     Physical Exam Vitals and nursing note reviewed.  Constitutional:      General: She is not in acute distress.    Appearance: She is well-developed.  HENT:     Head: Normocephalic  and atraumatic.  Eyes:     Conjunctiva/sclera:  Conjunctivae normal.  Cardiovascular:     Rate and Rhythm: Normal rate and regular rhythm.     Heart sounds: No murmur heard. Pulmonary:     Effort: Pulmonary effort is normal. No respiratory distress.     Breath sounds: Normal breath sounds.  Abdominal:     Palpations: Abdomen is soft.     Tenderness: There is abdominal tenderness (Mild) in the suprapubic area. There is no right CVA tenderness, left CVA tenderness, guarding or rebound.   Musculoskeletal:        General: No swelling.     Cervical back: Neck supple.  Skin:    General: Skin is warm and dry.     Capillary Refill: Capillary refill takes less than 2 seconds.  Neurological:     Mental Status: She is alert.  Psychiatric:        Mood and Affect: Mood normal.      UC Treatments / Results  Labs (all labs ordered are listed, but only abnormal results are displayed) Labs Reviewed  POCT URINALYSIS DIP (MANUAL ENTRY) - Abnormal; Notable for the following components:      Result Value   Ketones, POC UA trace (5) (*)    Blood, UA moderate (*)    All other components within normal limits  URINE CULTURE  POCT URINE PREGNANCY  CERVICOVAGINAL ANCILLARY ONLY    EKG   Radiology No results found.  Procedures Procedures (including critical care time)  Medications Ordered in UC Medications - No data to display  Initial Impression / Assessment and Plan / UC Course  I have reviewed the triage vital signs and the nursing notes.  Pertinent labs & imaging results that were available during my care of the patient were reviewed by me and considered in my medical decision making (see chart for details).     Suprapubic pain - Plan: POC urinalysis dipstick, POC urinalysis dipstick, POCT urine pregnancy, POCT urine pregnancy  Vagina itching  Vaginal discharge - Plan: POCT urine pregnancy, POCT urine pregnancy  Infected pierced belly button   Urinalysis done today.  This  does show some red blood cells but otherwise not suggestive of an infectious process.  Given the symptoms we will send this for culture and if bacteria does grow from this we will contact you to start antibiotic treatment.  Screening swab done today and results will be available in 24-48 hours. We will contact you if we need to arrange additional treatment based on your testing. Negative results will be on your MyChart account. Abstain from sex until you receive your final results.  Use a condom for sexual encounters. If you have any worsening or changing symptoms including abnormal discharge, pelvic pain, abdominal pain, fever, nausea, or vomiting, then you should be reevaluated.   For the area on the bellybutton around the piercing, we will use Mupirocin  ointment twice daily to the affected area.  If the area does not improve, this may be secondary to an allergic reaction to the metal and may need to consider changing to a different type of piercing.  Final Clinical Impressions(s) / UC Diagnoses   Final diagnoses:  Suprapubic pain  Vagina itching  Vaginal discharge  Infected pierced belly button     Discharge Instructions      Urinalysis done today.  This does show some red blood cells but otherwise not suggestive of an infectious process.  Given the symptoms we will send this for culture and if bacteria does grow from this we will  contact you to start antibiotic treatment.  Screening swab done today and results will be available in 24-48 hours. We will contact you if we need to arrange additional treatment based on your testing. Negative results will be on your MyChart account. Abstain from sex until you receive your final results.  Use a condom for sexual encounters. If you have any worsening or changing symptoms including abnormal discharge, pelvic pain, abdominal pain, fever, nausea, or vomiting, then you should be reevaluated.   For the area on the bellybutton around the piercing, we  will use Mupirocin  ointment twice daily to the affected area.  If the area does not improve, this may be secondary to an allergic reaction to the metal and may need to consider changing to a different type of piercing.    ED Prescriptions     Medication Sig Dispense Auth. Provider   mupirocin  ointment (BACTROBAN ) 2 % Apply 1 Application topically 2 (two) times daily. 22 g Teresa Almarie LABOR, [place]      PDMP not reviewed this encounter.   Teresa Almarie LABOR, PA-C 03/07/24 1139

## 2024-03-07 NOTE — Discharge Instructions (Addendum)
 Urinalysis done today.  This does show some red blood cells but otherwise not suggestive of an infectious process.  Given the symptoms we will send this for culture and if bacteria does grow from this we will contact you to start antibiotic treatment.  Screening swab done today and results will be available in 24-48 hours. We will contact you if we need to arrange additional treatment based on your testing. Negative results will be on your MyChart account. Abstain from sex until you receive your final results.  Use a condom for sexual encounters. If you have any worsening or changing symptoms including abnormal discharge, pelvic pain, abdominal pain, fever, nausea, or vomiting, then you should be reevaluated.   For the area on the bellybutton around the piercing, we will use Mupirocin  ointment twice daily to the affected area.  If the area does not improve, this may be secondary to an allergic reaction to the metal and may need to consider changing to a different type of piercing.

## 2024-03-08 LAB — URINE CULTURE

## 2024-03-09 ENCOUNTER — Ambulatory Visit (HOSPITAL_COMMUNITY): Payer: Self-pay

## 2024-03-09 LAB — CERVICOVAGINAL ANCILLARY ONLY
Bacterial Vaginitis (gardnerella): POSITIVE — AB
Candida Glabrata: NEGATIVE
Candida Vaginitis: NEGATIVE
Chlamydia: POSITIVE — AB
Comment: NEGATIVE
Comment: NEGATIVE
Comment: NEGATIVE
Comment: NEGATIVE
Comment: NEGATIVE
Comment: NORMAL
Neisseria Gonorrhea: NEGATIVE
Trichomonas: NEGATIVE

## 2024-03-09 MED ORDER — DOXYCYCLINE HYCLATE 100 MG PO TABS
100.0000 mg | ORAL_TABLET | Freq: Two times a day (BID) | ORAL | 0 refills | Status: AC
Start: 2024-03-09 — End: 2024-03-16

## 2024-03-09 MED ORDER — METRONIDAZOLE 0.75 % VA GEL: 1.0000 | Freq: Every day | VAGINAL | 0 refills | Status: AC

## 2024-03-25 ENCOUNTER — Encounter: Admitting: Obstetrics and Gynecology

## 2024-03-25 DIAGNOSIS — R87612 Low grade squamous intraepithelial lesion on cytologic smear of cervix (LGSIL): Secondary | ICD-10-CM

## 2024-03-25 DIAGNOSIS — R8789 Other abnormal findings in specimens from female genital organs: Secondary | ICD-10-CM

## 2024-04-01 ENCOUNTER — Other Ambulatory Visit (HOSPITAL_COMMUNITY)
Admission: RE | Admit: 2024-04-01 | Discharge: 2024-04-01 | Disposition: A | Discharge status: Home / Self Care | Source: Ambulatory Visit | Attending: Obstetrics & Gynecology | Admitting: Obstetrics & Gynecology

## 2024-04-01 ENCOUNTER — Encounter: Payer: Self-pay | Admitting: Obstetrics & Gynecology

## 2024-04-01 ENCOUNTER — Ambulatory Visit (INDEPENDENT_AMBULATORY_CARE_PROVIDER_SITE_OTHER): Admitting: Obstetrics & Gynecology

## 2024-04-01 VITALS — BP 117/84 | HR 82 | Ht 61.0 in | Wt 150.0 lb

## 2024-04-01 DIAGNOSIS — Z113 Encounter for screening for infections with a predominantly sexual mode of transmission: Secondary | ICD-10-CM | POA: Diagnosis present

## 2024-04-01 DIAGNOSIS — B977 Papillomavirus as the cause of diseases classified elsewhere: Secondary | ICD-10-CM

## 2024-04-01 DIAGNOSIS — R87612 Low grade squamous intraepithelial lesion on cytologic smear of cervix (LGSIL): Secondary | ICD-10-CM

## 2024-04-01 DIAGNOSIS — B3731 Acute candidiasis of vulva and vagina: Secondary | ICD-10-CM

## 2024-04-01 DIAGNOSIS — N871 Moderate cervical dysplasia: Secondary | ICD-10-CM

## 2024-04-01 DIAGNOSIS — N870 Mild cervical dysplasia: Secondary | ICD-10-CM

## 2024-04-01 MED ORDER — FLUCONAZOLE 150 MG PO TABS: 150.0000 mg | ORAL_TABLET | Freq: Once | ORAL | 0 refills | Status: AC

## 2024-04-01 NOTE — Addendum Note (Signed)
 Addended by: STARLA HARLAND BROCKS on: 04/01/2024 10:08 AM   Modules accepted: Orders

## 2024-04-01 NOTE — Progress Notes (Addendum)
    GYNECOLOGY PROGRESS NOTE  Subjective:    Patient ID: Natalie [name], female    DOB: 12/02/2000, 23 y.o.   MRN: [id number]  HPI  Patient is a 23 y.o. 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 She started Gardasil at age 23 and got 2 shots. She also says that she is cramping with her IUD and is requesting to have her strings clipped. She would like a swab for STI testing but declines blood work. She has a new partner for the last 2 weeks.  She was treated for + CT 12/2023 and had TOC 01/2024.  The following portions of the patient's history were reviewed and updated as appropriate: allergies, current medications, past family history, past medical history, past social history, past surgical history, and problem list.  Review of Systems Pertinent items are noted in HPI.   Objective:   Blood pressure 117/84, pulse 82, height 5' 1 (1.549 m), weight 150 lb (68 kg). Body mass index is 28.34 kg/m. Well nourished, well hydrated Black female, no apparent distress She is ambulating and conversing normally. Consent signed, time out done Speculum placed. I obtained a swab tor Aptima testing and clipped her Mirena  strings almost flush with the cervical os. Large amount of clumpy white discharge c/w yeast noted. Cervix prepped with acetic acid. Transformation zone seen in its entirety. Colpo adequate. Colposcopy reveals punctation and mosacism (HGSIL) in a circumferential fashion around the entire cervix extending out about 1 cm in all directions. I obtained a biopsy at the 12 o'clock position. Silver nitrate was used to achieve hemostasis. ECC obtained. She tolerated the procedure well.    Assessment:   1. LGSIL on Pap smear of cervix   2. High risk HPV infection   3.      STI testing/screening 4.      Yeast infection Plan:   1. LGSIL on Pap smear of cervix (Primary) - await pathology 2. High risk HPV infection - rec condoms at all times to avoid future  infections. 3. Diflucan  prescribed 4. Aptima sent

## 2024-04-02 LAB — CERVICOVAGINAL ANCILLARY ONLY
Bacterial Vaginitis (gardnerella): POSITIVE — AB
Candida Glabrata: POSITIVE — AB
Candida Vaginitis: POSITIVE — AB
Chlamydia: NEGATIVE
Comment: NEGATIVE
Comment: NEGATIVE
Comment: NEGATIVE
Comment: NEGATIVE
Comment: NEGATIVE
Comment: NORMAL
Neisseria Gonorrhea: NEGATIVE
Trichomonas: NEGATIVE

## 2024-04-03 LAB — SURGICAL PATHOLOGY

## 2024-04-06 ENCOUNTER — Encounter: Payer: Self-pay | Admitting: Obstetrics & Gynecology

## 2024-04-06 ENCOUNTER — Other Ambulatory Visit: Payer: Self-pay | Admitting: Obstetrics & Gynecology

## 2024-04-06 DIAGNOSIS — B9689 Other specified bacterial agents as the cause of diseases classified elsewhere: Secondary | ICD-10-CM

## 2024-04-06 DIAGNOSIS — B379 Candidiasis, unspecified: Secondary | ICD-10-CM

## 2024-04-06 DIAGNOSIS — N871 Moderate cervical dysplasia: Secondary | ICD-10-CM | POA: Insufficient documentation

## 2024-04-06 MED ORDER — METRONIDAZOLE 500 MG PO TABS: 500.0000 mg | ORAL_TABLET | Freq: Two times a day (BID) | ORAL | 0 refills | Status: DC

## 2024-04-06 MED ORDER — FLUCONAZOLE 150 MG PO TABS
ORAL_TABLET | ORAL | 2 refills | Status: DC
Start: 2024-04-06 — End: 2024-04-13

## 2024-04-06 NOTE — Progress Notes (Signed)
 Glabrata and bv seen on Aptima Message sent Diflucan  and flagyl  prescribed.

## 2024-04-08 ENCOUNTER — Encounter

## 2024-04-13 ENCOUNTER — Ambulatory Visit
Admission: EM | Admit: 2024-04-13 | Discharge: 2024-04-13 | Disposition: A | Discharge status: Home / Self Care | Attending: Nurse Practitioner | Admitting: Nurse Practitioner

## 2024-04-13 ENCOUNTER — Encounter: Payer: Self-pay | Admitting: Emergency Medicine

## 2024-04-13 DIAGNOSIS — L292 Pruritus vulvae: Secondary | ICD-10-CM | POA: Insufficient documentation

## 2024-04-13 DIAGNOSIS — R300 Dysuria: Secondary | ICD-10-CM | POA: Insufficient documentation

## 2024-04-13 DIAGNOSIS — Z113 Encounter for screening for infections with a predominantly sexual mode of transmission: Secondary | ICD-10-CM | POA: Diagnosis present

## 2024-04-13 DIAGNOSIS — N898 Other specified noninflammatory disorders of vagina: Secondary | ICD-10-CM | POA: Diagnosis present

## 2024-04-13 LAB — POCT URINE DIPSTICK
Bilirubin, UA: NEGATIVE
Blood, UA: NEGATIVE
Glucose, UA: NEGATIVE mg/dL
Ketones, POC UA: NEGATIVE mg/dL
Nitrite, UA: NEGATIVE
POC PROTEIN,UA: NEGATIVE
Spec Grav, UA: 1.015 (ref 1.010–1.025)
Urobilinogen, UA: 0.2 U/dL
pH, UA: 7 (ref 5.0–8.0)

## 2024-04-13 LAB — POCT URINE PREGNANCY: Preg Test, Ur: NEGATIVE

## 2024-04-13 MED ORDER — METRONIDAZOLE 0.75 % VA GEL: 1.0000 | Freq: Every day | VAGINAL | 0 refills | Status: AC

## 2024-04-13 MED ORDER — FLUCONAZOLE 150 MG PO TABS: 150.0000 mg | ORAL_TABLET | ORAL | 0 refills | Status: AC

## 2024-04-13 NOTE — Discharge Instructions (Addendum)
 You were seen today for vaginal discharge, painful urination and STD screening.  The vaginal discharge h can occur when the normal balance of bacteria in the vagina changes. This can be influenced by various factors, including new or multiple sexual partners, unprotected sex, douching, smoking, certain antibiotics, or pregnancy. It's also possible to develop a vaginal infection even without being sexually active.  Tests were performed today to check for bacteria, yeast, gonorrhea, chlamydia, trichomonas, HIV and syphilis. While results are pending, treatment has been initiated for the most common non-STD causes--bacterial vaginosis and yeast infection.  During this time, avoid douching or using vaginal sprays or deodorants. Wear cotton or cotton-lined underwear to improve airflow and reduce moisture. Be sure to stay hydrated by drinking plenty of fluids.  Please do not have any sexual activity until your test results have returned, any necessary treatment has been completed and your symptoms have resolved.  Please note that while you are taking the prescribed Flagyl  (metronidazole ) it is very important to avoid drinking alcohol while on this medication. Drinking alcohol can cause severe side effects, such as nausea, vomiting, and headaches. Please refrain from consuming any alcohol until you have completed the full course of the medication.   You will only be contacted if any of your test results are positive. You can also review your results through your MyChart account.

## 2024-04-13 NOTE — ED Triage Notes (Signed)
 Pt presents c/o vaginal discomfort and discharge x 4 days. Pt reports she has burning when she urinates. Pt reports the itching is severe. Pt is requesting STD test including bloodworm.

## 2024-04-13 NOTE — ED Provider Notes (Signed)
 [facility]    CSN: [id number] Arrival date & time: 04/13/24  1055      History   Chief Complaint Chief Complaint  Patient presents with   Vaginal Discharge   Vaginal Itching    HPI Natalie [name] is a 23 y.o. female.   Discussed the use of AI scribe software for clinical note transcription with the patient, who gave verbal consent to proceed.   The patient presents with vaginal discharge and itching for the past 4 days. She reports experiencing discomfort during urination. The patient describes the vaginal discharge as varying in color, sometimes appearing white and cloudy grayish, and other times yellow. She notes that the consistency of the discharge fluctuates between thick and thin. The patient also mentions experiencing itching in the vaginal area. She denies any abdominal pain, nausea, vomiting, or fever.  The patient reports being sexually active with approximately three female partners in the past 3 months.She uses condoms inconsistently. The patient has an intrauterine device recently inserted and not has had a period since its insertion.   The following sections of the patient's history were reviewed and updated as appropriate: allergies, current medications, past family history, past medical history, past social history, past surgical history, and problem list.      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   Depression 03/02/2021   IUD (intrauterine device) in place 12/10/2020   Hidradenitis suppurativa 10/08/2019   Dysmenorrhea 05/02/2015   Asthma, mild intermittent, well-controlled 10/15/2013   Allergic rhinitis 10/15/2013    Past  Surgical History:  Procedure Laterality Date   CESAREAN SECTION N/A 12/10/2020   Procedure: CESAREAN SECTION;  Surgeon: Ozan, Jennifer, DO;  Location: [facility];  Service: Obstetrics;  Laterality: N/A;   HYDRADENITIS EXCISION Bilateral 12/08/2019   Procedure: WIDE EXCISION BILATERAL AXILLARY HIDRADENITIS;  Surgeon: Vernetta Berg, MD;  Location: [facility] OR;  Service: General;  Laterality: Bilateral;    OB History     Gravida  1   Para      Term      Preterm      AB      Living         SAB      IAB      Ectopic      Multiple      Live Births               Home Medications    Prior to Admission medications   Medication Sig Start Date End Date Taking? Authorizing Provider  fluconazole  (DIFLUCAN ) 150 MG tablet Take 1 tablet (150 mg total) by mouth every 3 (three) days for 2 doses. 04/13/24 04/17/24 Yes Iola Lukes, FNP  metroNIDAZOLE  (METROGEL ) 0.75 % vaginal gel Place 1 Applicatorful vaginally at bedtime for 5 days. 04/13/24 04/18/24 Yes Iola Lukes, FNP  cetirizine  (ZYRTEC ) 10 MG tablet Take 10 mg by mouth daily.    [provider]  mupirocin  ointment (BACTROBAN ) 2 % Apply 1 Application topically 2 (two) times daily. 03/07/24   White, Elizabeth A, PA-C  sertraline  (ZOLOFT ) 100 MG tablet Take 1 tablet (100 mg total) by mouth daily.  04/12/21   Lola Donnice HERO, MD  VENTOLIN  HFA 108 [phone] Base) MCG/ACT inhaler SMARTSIG:2 Puff(s) By Mouth Every 6 Hours PRN    [provider]    Family History Family History  Problem Relation Age of Onset   Learning disabilities Mother    Mental illness Mother        Depression   Diabetes Paternal Aunt    Asthma Maternal Grandmother    Hypertension Maternal Grandmother    Learning disabilities Maternal Grandmother    Mental illness Maternal Grandmother        Depression   Stroke Maternal Grandmother    Hypertension Maternal Grandfather    Alcohol abuse Maternal Grandfather    Drug abuse Maternal  Grandfather     Social History Social History   Tobacco Use   Smoking status: Former    Types: Cigars    Passive exposure: Never   Smokeless tobacco: Never   Tobacco comments:    last used 03-24-20  Vaping Use   Vaping status: Every Day   Substances: Nicotine  Substance Use Topics   Alcohol use: Yes   Drug use: Not Currently    Types: Marijuana     Allergies   Pollen extract   Review of Systems Review of Systems  Gastrointestinal:  Negative for abdominal pain, nausea and vomiting.  Genitourinary:  Positive for dysuria and vaginal discharge. Negative for menstrual problem (irregular menses, IUD placed about a month ago; haven't had cycle since it was placed).       Vaginal itching and irritation. No odor.    All other systems reviewed and are negative.    Physical Exam Triage Vital Signs ED Triage Vitals  Encounter Vitals Group     BP 04/13/24 1126 117/74     Girls Systolic BP Percentile --      Girls Diastolic BP Percentile --      Boys Systolic BP Percentile --      Boys Diastolic BP Percentile --      Pulse Rate 04/13/24 1126 85     Resp 04/13/24 1126 16     Temp 04/13/24 1126 98.5 F (36.9 C)     Temp Source 04/13/24 1126 Oral     SpO2 04/13/24 1126 98 %     Weight 04/13/24 1125 149 lb 14.6 oz (68 kg)     Height --      Head Circumference --      Peak Flow --      Pain Score 04/13/24 1124 4     Pain Loc --      Pain Education --      Exclude from Growth Chart --    No data found.  Updated Vital Signs BP 117/74 (BP Location: Left Arm)   Pulse 85   Temp 98.5 F (36.9 C) (Oral)   Resp 16   Wt 149 lb 14.6 oz (68 kg)   LMP  (LMP Unknown)   SpO2 98%   BMI 28.33 kg/m   Visual Acuity Right Eye Distance:   Left Eye Distance:   Bilateral Distance:    Right Eye Near:   Left Eye Near:    Bilateral Near:     Physical Exam Constitutional:      General: She is not in acute distress.    Appearance: Normal appearance. She is not ill-appearing,  toxic-appearing or diaphoretic.  HENT:     Head: Normocephalic.     Nose: Nose normal.     Mouth/Throat:  Mouth: Mucous membranes are moist.  Eyes:     Conjunctiva/sclera: Conjunctivae normal.  Cardiovascular:     Rate and Rhythm: Normal rate.  Pulmonary:     Effort: Pulmonary effort is normal.  Abdominal:     Palpations: Abdomen is soft.  Genitourinary:    Comments: Deferred; patient performed self-swab for Aptima testing  Musculoskeletal:        General: Normal range of motion.     Cervical back: Normal range of motion and neck supple.  Skin:    General: Skin is warm and dry.  Neurological:     General: No focal deficit present.     Mental Status: She is alert and oriented to person, place, and time.  Psychiatric:        Mood and Affect: Mood normal.        Behavior: Behavior normal.      UC Treatments / Results  Labs (all labs ordered are listed, but only abnormal results are displayed) Labs Reviewed  POCT URINE DIPSTICK - Abnormal; Notable for the following components:      Result Value   Leukocytes, UA Trace (*)    All other components within normal limits  POCT URINE PREGNANCY - Normal  URINE CULTURE  RPR  HIV ANTIBODY (ROUTINE TESTING W REFLEX)  CERVICOVAGINAL ANCILLARY ONLY    EKG   Radiology No results found.  Procedures Procedures (including critical care time)  Medications Ordered in UC Medications - No data to display  Initial Impression / Assessment and Plan / UC Course  I have reviewed the triage vital signs and the nursing notes.  Pertinent labs & imaging results that were available during my care of the patient were reviewed by me and considered in my medical decision making (see chart for details).    The patient was evaluated today for vaginal discharge, dysuria and STD screening.  Assist with trace leukocytes.  No other abnormalities within the UA noted.  Will hold off on initiating therapy for UTI.  Urine culture pending and will  treat if positive.  Testing was obtained for bacterial vaginosis, yeast, gonorrhea, chlamydia, trichomonas, HIV and syphilis. While awaiting results, empiric treatment was initiated for the most likely non-sexually transmitted causes, including bacterial vaginosis and yeast infection.   Patient education was provided on avoiding douching, vaginal sprays, or deodorants, and wearing cotton-lined underwear to reduce moisture and improve airflow. She was advised to stay hydrated and use barrier protection if sexually active to reduce the risk of recurrent infection. The patient was informed that she will only be contacted if test results are positive, and results will also be available for review on her MyChart account. She was advised to follow up with her primary care provider or gynecologist if symptoms do not improve with treatment, and to seek care sooner if she develops fever, pelvic pain, or worsening discharge.  Today's evaluation has revealed no signs of a dangerous process. Discussed diagnosis with patient and/or guardian. Patient and/or guardian aware of their diagnosis, possible red flag symptoms to watch out for and need for close follow up. Patient and/or guardian understands verbal and written discharge instructions. Patient and/or guardian comfortable with plan and disposition.  Patient and/or guardian has a clear mental status at this time, good insight into illness (after discussion and teaching) and has clear judgment to make decisions regarding their care  Documentation was completed with the aid of voice recognition software. Transcription may contain typographical errors.   Final diagnoses:  Vaginal discharge  Dysuria  Screening examination for STD (sexually transmitted disease)     Discharge Instructions      You were seen today for vaginal discharge, painful urination and STD screening.  The vaginal discharge h can occur when the normal balance of bacteria in the vagina  changes. This can be influenced by various factors, including new or multiple sexual partners, unprotected sex, douching, smoking, certain antibiotics, or pregnancy. It's also possible to develop a vaginal infection even without being sexually active.  Tests were performed today to check for bacteria, yeast, gonorrhea, chlamydia, trichomonas, HIV and syphilis. While results are pending, treatment has been initiated for the most common non-STD causes--bacterial vaginosis and yeast infection.  During this time, avoid douching or using vaginal sprays or deodorants. Wear cotton or cotton-lined underwear to improve airflow and reduce moisture. Be sure to stay hydrated by drinking plenty of fluids.  Please do not have any sexual activity until your test results have returned, any necessary treatment has been completed and your symptoms have resolved.  Please note that while you are taking the prescribed Flagyl  (metronidazole ) it is very important to avoid drinking alcohol while on this medication. Drinking alcohol can cause severe side effects, such as nausea, vomiting, and headaches. Please refrain from consuming any alcohol until you have completed the full course of the medication.   You will only be contacted if any of your test results are positive. You can also review your results through your MyChart account.      ED Prescriptions     Medication Sig Dispense Auth. Provider   metroNIDAZOLE  (METROGEL ) 0.75 % vaginal gel Place 1 Applicatorful vaginally at bedtime for 5 days. 50 g Tyia Binford, FNP   fluconazole  (DIFLUCAN ) 150 MG tablet Take 1 tablet (150 mg total) by mouth every 3 (three) days for 2 doses. 2 tablet Iola Lukes, FNP      PDMP not reviewed this encounter.   Iola Lukes, [place] 04/13/24 1230

## 2024-04-14 LAB — RPR: RPR Ser Ql: NONREACTIVE

## 2024-04-14 LAB — CERVICOVAGINAL ANCILLARY ONLY
Bacterial Vaginitis (gardnerella): POSITIVE — AB
Candida Glabrata: NEGATIVE
Candida Vaginitis: NEGATIVE
Chlamydia: POSITIVE — AB
Comment: NEGATIVE
Comment: NEGATIVE
Comment: NEGATIVE
Comment: NEGATIVE
Comment: NEGATIVE
Comment: NORMAL
Neisseria Gonorrhea: NEGATIVE
Trichomonas: NEGATIVE

## 2024-04-14 LAB — URINE CULTURE: Culture: NO GROWTH

## 2024-04-14 LAB — HIV ANTIBODY (ROUTINE TESTING W REFLEX): HIV Screen 4th Generation wRfx: NONREACTIVE

## 2024-04-15 ENCOUNTER — Ambulatory Visit: Payer: Self-pay | Admitting: Nurse Practitioner

## 2024-04-15 MED ORDER — DOXYCYCLINE HYCLATE 100 MG PO CAPS: 100.0000 mg | ORAL_CAPSULE | Freq: Two times a day (BID) | ORAL | 0 refills | Status: AC

## 2024-05-11 ENCOUNTER — Ambulatory Visit

## 2024-05-13 ENCOUNTER — Ambulatory Visit (HOSPITAL_COMMUNITY)

## 2024-05-15 ENCOUNTER — Ambulatory Visit (HOSPITAL_COMMUNITY)

## 2024-05-22 ENCOUNTER — Encounter (HOSPITAL_COMMUNITY): Payer: Self-pay

## 2024-05-22 ENCOUNTER — Ambulatory Visit (HOSPITAL_COMMUNITY)
Admission: RE | Admit: 2024-05-22 | Discharge: 2024-05-22 | Disposition: A | Discharge status: Home / Self Care | Source: Ambulatory Visit | Attending: Family Medicine | Admitting: Family Medicine

## 2024-05-22 VITALS — BP 110/72 | HR 75 | Temp 98.7°F | Resp 16

## 2024-05-22 DIAGNOSIS — N3090 Cystitis, unspecified without hematuria: Secondary | ICD-10-CM | POA: Insufficient documentation

## 2024-05-22 DIAGNOSIS — N760 Acute vaginitis: Secondary | ICD-10-CM

## 2024-05-22 DIAGNOSIS — R300 Dysuria: Secondary | ICD-10-CM | POA: Insufficient documentation

## 2024-05-22 LAB — POCT URINALYSIS DIP (MANUAL ENTRY)
Bilirubin, UA: NEGATIVE
Blood, UA: NEGATIVE
Glucose, UA: NEGATIVE mg/dL
Nitrite, UA: POSITIVE — AB
Protein Ur, POC: NEGATIVE mg/dL
Spec Grav, UA: 1.02 (ref 1.010–1.025)
Urobilinogen, UA: 0.2 U/dL
pH, UA: 7 (ref 5.0–8.0)

## 2024-05-22 MED ORDER — NITROFURANTOIN MONOHYD MACRO 100 MG PO CAPS: 100.0000 mg | ORAL_CAPSULE | Freq: Two times a day (BID) | ORAL | 0 refills | Status: DC

## 2024-05-22 MED ORDER — METRONIDAZOLE 0.75 % VA GEL: 1.0000 | Freq: Two times a day (BID) | VAGINAL | 0 refills | Status: AC

## 2024-05-22 NOTE — ED Triage Notes (Signed)
 Patient here today with c/o vaginal discharge/odor and lower abd pain X 2 weeks. Patient states that her symptoms have been worsening.

## 2024-05-22 NOTE — ED Provider Notes (Signed)
 [facility]    CSN: [id number] Arrival date & time: 05/22/24  1732      History   Chief Complaint Chief Complaint  Patient presents with   Vaginal Discharge    HPI Natalie [name] is a 23 y.o. female.    Vaginal Discharge  Here for vaginal discharge has been going on for about 2 weeks he has also had some odor.  He has had some dysuria.  He will have some suprapubic pain when her bladder is full but otherwise has not been hurting.  She has had a little vaginal itching and irritation.  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   Depression 03/02/2021   IUD (intrauterine device) in place 12/10/2020   Hidradenitis suppurativa 10/08/2019   Dysmenorrhea 05/02/2015   Asthma, mild intermittent, well-controlled 10/15/2013   Allergic rhinitis 10/15/2013    Past Surgical History:  Procedure Laterality Date   CESAREAN SECTION N/A 12/10/2020   Procedure: CESAREAN SECTION;  Surgeon: Ozan, Jennifer, DO;  Location: [facility];  Service: Obstetrics;  Laterality: N/A;   HYDRADENITIS EXCISION Bilateral 12/08/2019   Procedure: WIDE EXCISION BILATERAL AXILLARY HIDRADENITIS;  Surgeon: Vernetta Berg, MD;  Location: [facility] OR;  Service: General;  Laterality: Bilateral;    OB History     Gravida  1   Para      Term      Preterm      AB      Living         SAB      IAB      Ectopic      Multiple      Live Births               Home Medications    Prior to Admission medications   Medication Sig Start Date End Date Taking?  Authorizing Provider  metroNIDAZOLE  (METROGEL ) 0.75 % vaginal gel Place 1 Applicatorful vaginally 2 (two) times daily for 5 days. 05/22/24 05/27/24 Yes Heavan Francom, Sharlet POUR, MD  nitrofurantoin , macrocrystal-monohydrate, (MACROBID ) 100 MG capsule Take 1 capsule (100 mg total) by mouth 2 (two) times daily. 05/22/24  Yes Vonna Sharlet POUR, MD  cetirizine  (ZYRTEC ) 10 MG tablet Take 10 mg by mouth daily.    [provider]  VENTOLIN  HFA 108 (90 Base) MCG/ACT inhaler SMARTSIG:2 Puff(s) By Mouth Every 6 Hours PRN    [provider]    Family History Family History  Problem Relation Age of Onset   Learning disabilities Mother    Mental illness Mother        Depression   Diabetes Paternal Aunt    Asthma Maternal Grandmother    Hypertension Maternal Grandmother    Learning disabilities Maternal Grandmother    Mental illness Maternal Grandmother        Depression   Stroke Maternal Grandmother    Hypertension Maternal Grandfather    Alcohol abuse Maternal Grandfather    Drug abuse Maternal Grandfather     Social History  Social History   Tobacco Use   Smoking status: Former    Types: Cigars    Passive exposure: Never   Smokeless tobacco: Never   Tobacco comments:    last used 03-24-20  Vaping Use   Vaping status: Every Day   Substances: Nicotine  Substance Use Topics   Alcohol use: Yes   Drug use: Yes    Types: Marijuana     Allergies   Pollen extract   Review of Systems Review of Systems  Genitourinary:  Positive for vaginal discharge.     Physical Exam Triage Vital Signs ED Triage Vitals  Encounter Vitals Group     BP 05/22/24 1811 110/72     Girls Systolic BP Percentile --      Girls Diastolic BP Percentile --      Boys Systolic BP Percentile --      Boys Diastolic BP Percentile --      Pulse Rate 05/22/24 1811 75     Resp 05/22/24 1811 16     Temp 05/22/24 1811 98.7 F (37.1 C)     Temp Source 05/22/24 1811 Oral     SpO2 05/22/24 1811 98 %      Weight --      Height --      Head Circumference --      Peak Flow --      Pain Score 05/22/24 1807 7     Pain Loc --      Pain Education --      Exclude from Growth Chart --    No data found.  Updated Vital Signs BP 110/72 (BP Location: Right Arm)   Pulse 75   Temp 98.7 F (37.1 C) (Oral)   Resp 16   LMP  (LMP Unknown)   SpO2 98%   Visual Acuity Right Eye Distance:   Left Eye Distance:   Bilateral Distance:    Right Eye Near:   Left Eye Near:    Bilateral Near:     Physical Exam Vitals reviewed.  Constitutional:      General: She is not in acute distress.    Appearance: She is not ill-appearing, toxic-appearing or diaphoretic.  HENT:     Mouth/Throat:     Mouth: Mucous membranes are moist.  Eyes:     Extraocular Movements: Extraocular movements intact.     Conjunctiva/sclera: Conjunctivae normal.     Pupils: Pupils are equal, round, and reactive to light.  Cardiovascular:     Rate and Rhythm: Normal rate and regular rhythm.     Heart sounds: No murmur heard. Pulmonary:     Effort: Pulmonary effort is normal.     Breath sounds: Normal breath sounds.  Abdominal:     Palpations: Abdomen is soft.     Tenderness: There is no abdominal tenderness.  Skin:    Coloration: Skin is not pale.  Neurological:     Mental Status: She is alert and oriented to person, place, and time.  Psychiatric:        Behavior: Behavior normal.      UC Treatments / Results  Labs (all labs ordered are listed, but only abnormal results are displayed) Labs Reviewed  POCT URINALYSIS DIP (MANUAL ENTRY) - Abnormal; Notable for the following components:      Result Value   Clarity, UA cloudy (*)    Ketones, POC UA trace (5) (*)    Nitrite, UA Positive (*)    Leukocytes, UA Small (1+) (*)  All other components within normal limits  URINE CULTURE  CERVICOVAGINAL ANCILLARY ONLY    EKG   Radiology No results found.  Procedures Procedures (including critical care  time)  Medications Ordered in UC Medications - No data to display  Initial Impression / Assessment and Plan / UC Course  I have reviewed the triage vital signs and the nursing notes.  Pertinent labs & imaging results that were available during my care of the patient were reviewed by me and considered in my medical decision making (see chart for details).     Urinalysis shows some nitrites and leuks.  Macrodantin  is sent in for the UTI.  Urine culture is sent and staff notified of that antibiotic needs to be changed.  Vaginal self swab is done, and we will notify of any positives on that and treat per protocol. She will be treated empirically with vaginal treatment for BV.  I think that is reasonable.  MetroGel  is sent in Final Clinical Impressions(s) / UC Diagnoses   Final diagnoses:  Dysuria  Acute vaginitis  Cystitis     Discharge Instructions      The urinalysis shows some nitrites and some white blood cells, which could be a sign of urinary infection.  Take nitrofurantoin  100 mg--1 capsule 2 times daily for 5 days  Urine culture is sent, and staff will notify you that it looks like the antibiotic needs to be changed.  Staff will notify you if there is anything positive on the swab. It can take 2-3 days for the tests to result, depending on the day of the week your test was taken. You will only be notified if there are any positives on the testing; test results will also go to your MyChart if you are signed up for MyChart.   Metronidazole  gel--1 applicatorful in the vagina 2 times daily for 5 days.      ED Prescriptions     Medication Sig Dispense Auth. Provider   nitrofurantoin , macrocrystal-monohydrate, (MACROBID ) 100 MG capsule Take 1 capsule (100 mg total) by mouth 2 (two) times daily. 10 capsule Vonna Sharlet POUR, MD   metroNIDAZOLE  (METROGEL ) 0.75 % vaginal gel Place 1 Applicatorful vaginally 2 (two) times daily for 5 days. 100 g Vonna Sharlet POUR, MD       PDMP not reviewed this encounter.   Vonna Sharlet POUR, MD 05/22/24 TYRA

## 2024-05-22 NOTE — Discharge Instructions (Signed)
 The urinalysis shows some nitrites and some white blood cells, which could be a sign of urinary infection.  Take nitrofurantoin  100 mg--1 capsule 2 times daily for 5 days  Urine culture is sent, and staff will notify you that it looks like the antibiotic needs to be changed.  Staff will notify you if there is anything positive on the swab. It can take 2-3 days for the tests to result, depending on the day of the week your test was taken. You will only be notified if there are any positives on the testing; test results will also go to your MyChart if you are signed up for MyChart.   Metronidazole  gel--1 applicatorful in the vagina 2 times daily for 5 days.

## 2024-05-24 LAB — URINE CULTURE: Culture: >=100000 — AB

## 2024-05-25 ENCOUNTER — Ambulatory Visit (HOSPITAL_COMMUNITY): Payer: Self-pay

## 2024-05-25 LAB — CERVICOVAGINAL ANCILLARY ONLY
Bacterial Vaginitis (gardnerella): POSITIVE — AB
Candida Glabrata: NEGATIVE
Candida Vaginitis: NEGATIVE
Chlamydia: NEGATIVE
Comment: NEGATIVE
Comment: NEGATIVE
Comment: NEGATIVE
Comment: NEGATIVE
Comment: NEGATIVE
Comment: NORMAL
Neisseria Gonorrhea: NEGATIVE
Trichomonas: NEGATIVE

## 2024-06-04 ENCOUNTER — Ambulatory Visit (HOSPITAL_COMMUNITY)
Admission: EM | Admit: 2024-06-04 | Discharge: 2024-06-04 | Disposition: A | Discharge status: Home / Self Care | Attending: Physician Assistant | Admitting: Physician Assistant

## 2024-06-04 ENCOUNTER — Encounter (HOSPITAL_COMMUNITY): Payer: Self-pay | Admitting: *Deleted

## 2024-06-04 ENCOUNTER — Other Ambulatory Visit: Payer: Self-pay

## 2024-06-04 DIAGNOSIS — R300 Dysuria: Secondary | ICD-10-CM | POA: Diagnosis not present

## 2024-06-04 DIAGNOSIS — N949 Unspecified condition associated with female genital organs and menstrual cycle: Secondary | ICD-10-CM | POA: Diagnosis not present

## 2024-06-04 LAB — POCT URINE DIPSTICK
Bilirubin, UA: NEGATIVE
Blood, UA: NEGATIVE
Glucose, UA: NEGATIVE mg/dL
Leukocytes, UA: NEGATIVE
Nitrite, UA: NEGATIVE
POC PROTEIN,UA: NEGATIVE
Spec Grav, UA: 1.02 (ref 1.010–1.025)
Urobilinogen, UA: 0.2 U/dL
pH, UA: 8 (ref 5.0–8.0)

## 2024-06-04 LAB — POCT URINE PREGNANCY: Preg Test, Ur: NEGATIVE

## 2024-06-04 MED ORDER — FLUCONAZOLE 150 MG PO TABS: 150.0000 mg | ORAL_TABLET | ORAL | 0 refills | Status: DC | PRN

## 2024-06-04 NOTE — Discharge Instructions (Addendum)
 VISIT SUMMARY:  You came in today because you have been experiencing ongoing urinary tract pain despite previous treatment for a urinary tract infection. You also mentioned occasional pelvic pain, itching between your labial folds, and a history of hidradenitis suppurativa.  YOUR PLAN:  -GENITOURINARY DISCOMFORT, POSSIBLE VAGINAL ETIOLOGY INCLUDING CANDIDIASIS: Your ongoing urinary tract pain and itching between your labial folds may be due to a yeast infection. A yeast infection is caused by an overgrowth of fungus in the vaginal area. You have been prescribed an antifungal medication to treat this possible infection, a medication called Diflucan . We are also waiting for the results of a swab test to further evaluate your condition.  -DEHYDRATION: Dehydration occurs when your body does not have enough water  to function properly. This can contribute to your discomfort. You are advised to increase your water  intake to help alleviate your symptoms.  INSTRUCTIONS:  Please take the prescribed antifungal medication as directed. Make sure to drink plenty of water  to stay hydrated. We will contact you with the results of your swab test once they are available.

## 2024-06-04 NOTE — ED Triage Notes (Signed)
 PT seen on 10-24 for STD and UTI. Pt returns today because she is still having UTI Sx's the patient reports her bladder does not feel right. Pt took the meds but not as directed and thinks the meds di dnot work.

## 2024-06-04 NOTE — ED Provider Notes (Signed)
 [facility]    CSN: [id number] Arrival date & time: 06/04/24  1329      History   Chief Complaint Chief Complaint  Patient presents with   Urinary Tract Infection   Dysuria   Urinary Frequency    HPI Natalie [name] is a 23 y.o. female.  has a past medical history of Allergy, Anemia affecting pregnancy in third trimester (10/04/2020), Asthma, Cesarean delivery delivered (12/10/2020), Sciatica of right side (11/07/2020), Tonsillitis, and Trichomoniasis.   HPI  Discussed the use of AI scribe software for clinical note transcription with the patient, who gave verbal consent to proceed.  The patient presents with persistent urinary tract pain despite previous treatment for a UTI.  She experiences ongoing urinary tract pain both during and outside of urination. Despite previous treatment for a urinary tract infection, she is unsure if she took the medication correctly. A repeat swab has been conducted to rule out other conditions. No fever or chills are present.  She occasionally experiences pelvic pain, which she attributes to having an IUD. She has a history of hidradenitis suppurativa, for which she has been prescribed doxycycline  in the past. The condition causes recurrent painful bumps despite treatment.  No recent vaginal pain, bleeding, or changes in discharge. She mentions itching between her labial folds, which she associates with a possible yeast infection.  She admits to skipping doses of her previous antibiotics and not drinking enough water  during treatment.   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   Depression 03/02/2021   IUD (intrauterine  device) in place 12/10/2020   Hidradenitis suppurativa 10/08/2019   Dysmenorrhea 05/02/2015   Asthma, mild intermittent, well-controlled 10/15/2013   Allergic rhinitis 10/15/2013    Past Surgical History:  Procedure Laterality Date   CESAREAN SECTION N/A 12/10/2020   Procedure: CESAREAN SECTION;  Surgeon: Ozan, Jennifer, DO;  Location: [facility];  Service: Obstetrics;  Laterality: N/A;   HYDRADENITIS EXCISION Bilateral 12/08/2019   Procedure: WIDE EXCISION BILATERAL AXILLARY HIDRADENITIS;  Surgeon: Vernetta Berg, MD;  Location: [facility];  Service: General;  Laterality: Bilateral;    OB History     Gravida  1   Para      Term      Preterm      AB      Living         SAB      IAB      Ectopic      Multiple      Live Births               Home Medications    Prior to Admission medications   Medication Sig Start Date End Date Taking? Authorizing Provider  fluconazole  (DIFLUCAN ) 150 MG tablet Take 1 tablet (150 mg total) by mouth every three (3) days as needed. May repeat in 3 days if symptoms not resolved 06/04/24  Yes Bralee Feldt E, PA-C  VENTOLIN  HFA 108 (90 Base) MCG/ACT inhaler SMARTSIG:2 Puff(s) By Mouth Every 6 Hours PRN    [provider]    Family History Family History  Problem Relation Age of Onset  Learning disabilities Mother    Mental illness Mother        Depression   Diabetes Paternal Aunt    Asthma Maternal Grandmother    Hypertension Maternal Grandmother    Learning disabilities Maternal Grandmother    Mental illness Maternal Grandmother        Depression   Stroke Maternal Grandmother    Hypertension Maternal Grandfather    Alcohol abuse Maternal Grandfather    Drug abuse Maternal Grandfather     Social History Social History   Tobacco Use   Smoking status: Former    Types: Cigars    Passive exposure: Never   Smokeless tobacco: Never   Tobacco comments:    last used 03-24-20  Vaping Use   Vaping status: Every Day    Substances: Nicotine  Substance Use Topics   Alcohol use: Yes   Drug use: Yes    Types: Marijuana     Allergies   Pollen extract   Review of Systems Review of Systems  Constitutional:  Negative for chills and fever.  Gastrointestinal:  Positive for abdominal pain.  Genitourinary:  Positive for dysuria. Negative for difficulty urinating, flank pain, genital sores, vaginal bleeding, vaginal discharge and vaginal pain.  Skin:  Negative for rash.     Physical Exam Triage Vital Signs ED Triage Vitals  Encounter Vitals Group     BP 06/04/24 1516 111/82     Girls Systolic BP Percentile --      Girls Diastolic BP Percentile --      Boys Systolic BP Percentile --      Boys Diastolic BP Percentile --      Pulse Rate 06/04/24 1516 (!) 58     Resp 06/04/24 1516 20     Temp 06/04/24 1516 98 F (36.7 C)     Temp src --      SpO2 06/04/24 1516 98 %     Weight --      Height --      Head Circumference --      Peak Flow --      Pain Score 06/04/24 1513 6     Pain Loc --      Pain Education --      Exclude from Growth Chart --    No data found.  Updated Vital Signs BP 111/82   Pulse (!) 58   Temp 98 F (36.7 C)   Resp 20   LMP  (LMP Unknown) Comment: irregular periods due to IUD  SpO2 98%   Visual Acuity Right Eye Distance:   Left Eye Distance:   Bilateral Distance:    Right Eye Near:   Left Eye Near:    Bilateral Near:     Physical Exam Vitals reviewed.  Constitutional:      General: She is awake.     Appearance: Normal appearance. She is well-developed and well-groomed.  HENT:     Head: Normocephalic and atraumatic.  Eyes:     General: Lids are normal. Gaze aligned appropriately.     Extraocular Movements: Extraocular movements intact.     Conjunctiva/sclera: Conjunctivae normal.  Pulmonary:     Effort: Pulmonary effort is normal.  Neurological:     Mental Status: She is alert and oriented to person, place, and time.  Psychiatric:        Attention  and Perception: Attention and perception normal.        Mood and Affect: Mood and affect normal.  Speech: Speech normal.        Behavior: Behavior normal. Behavior is cooperative.      UC Treatments / Results  Labs (all labs ordered are listed, but only abnormal results are displayed) Labs Reviewed  POCT URINE DIPSTICK - Abnormal; Notable for the following components:      Result Value   Clarity, UA cloudy (*)    Ketones, POC UA trace (5) (*)    All other components within normal limits  POCT URINE PREGNANCY  CERVICOVAGINAL ANCILLARY ONLY    EKG   Radiology No results found.  Procedures Procedures (including critical care time)  Medications Ordered in UC Medications - No data to display  Initial Impression / Assessment and Plan / UC Course  I have reviewed the triage vital signs and the nursing notes.  Pertinent labs & imaging results that were available during my care of the patient were reviewed by me and considered in my medical decision making (see chart for details).      Final Clinical Impressions(s) / UC Diagnoses   Final diagnoses:  Dysuria  Vaginal discomfort   Genitourinary discomfort, possible vaginal etiology including candidiasis Persistent genitourinary discomfort with dysuria and non-urinary pain. Urinalysis negative for UTI. Possible vaginal etiology, including candidiasis, suggested by itching and discomfort between labia. - Prescribed antifungal medication for possible yeast infection - Await swab results for further evaluation  Dehydration Possible dehydration indicated by urine analysis and symptoms of discomfort. UA negative for evidence of infection or hematuria  - Advised increased water  intake    Discharge Instructions      VISIT SUMMARY:  You came in today because you have been experiencing ongoing urinary tract pain despite previous treatment for a urinary tract infection. You also mentioned occasional pelvic pain, itching  between your labial folds, and a history of hidradenitis suppurativa.  YOUR PLAN:  -GENITOURINARY DISCOMFORT, POSSIBLE VAGINAL ETIOLOGY INCLUDING CANDIDIASIS: Your ongoing urinary tract pain and itching between your labial folds may be due to a yeast infection. A yeast infection is caused by an overgrowth of fungus in the vaginal area. You have been prescribed an antifungal medication to treat this possible infection, a medication called Diflucan . We are also waiting for the results of a swab test to further evaluate your condition.  -DEHYDRATION: Dehydration occurs when your body does not have enough water  to function properly. This can contribute to your discomfort. You are advised to increase your water  intake to help alleviate your symptoms.  INSTRUCTIONS:  Please take the prescribed antifungal medication as directed. Make sure to drink plenty of water  to stay hydrated. We will contact you with the results of your swab test once they are available.     ED Prescriptions     Medication Sig Dispense Auth. Provider   fluconazole  (DIFLUCAN ) 150 MG tablet Take 1 tablet (150 mg total) by mouth every three (3) days as needed. May repeat in 3 days if symptoms not resolved 2 tablet Eliseo Withers E, PA-C      PDMP not reviewed this encounter.   Marylene Rocky BRAVO, PA-C 06/04/24 1555

## 2024-06-05 ENCOUNTER — Ambulatory Visit: Payer: Self-pay

## 2024-06-05 LAB — CERVICOVAGINAL ANCILLARY ONLY
Bacterial Vaginitis (gardnerella): POSITIVE — AB
Candida Glabrata: NEGATIVE
Candida Vaginitis: NEGATIVE
Chlamydia: NEGATIVE
Comment: NEGATIVE
Comment: NEGATIVE
Comment: NEGATIVE
Comment: NEGATIVE
Comment: NEGATIVE
Comment: NORMAL
Neisseria Gonorrhea: NEGATIVE
Trichomonas: NEGATIVE

## 2024-06-05 MED ORDER — METRONIDAZOLE 500 MG PO TABS: 500.0000 mg | ORAL_TABLET | Freq: Two times a day (BID) | ORAL | 0 refills | Status: AC

## 2024-07-05 ENCOUNTER — Ambulatory Visit (HOSPITAL_COMMUNITY)
Admission: RE | Admit: 2024-07-05 | Discharge: 2024-07-05 | Disposition: A | Discharge status: Home / Self Care | Payer: MEDICAID | Source: Ambulatory Visit

## 2024-07-05 ENCOUNTER — Encounter (HOSPITAL_COMMUNITY): Payer: Self-pay

## 2024-07-05 VITALS — BP 110/76 | HR 80 | Temp 98.6°F | Resp 16

## 2024-07-05 DIAGNOSIS — N760 Acute vaginitis: Secondary | ICD-10-CM

## 2024-07-05 LAB — POCT URINE PREGNANCY: Preg Test, Ur: NEGATIVE

## 2024-07-05 NOTE — ED Triage Notes (Signed)
 PT REPORTS VAGINAL ITCHING FOR COUPLE DAYS. DENIES PAIN, DISCHARGE, OR KNOWN EXPOSURE TO STD.

## 2024-07-05 NOTE — ED Provider Notes (Signed)
 [facility]  Note:  This document was prepared using Conservation officer, historic buildings and may include unintentional dictation errors.  MRN: [id number] DOB: Nov 09, 2000  Subjective:   Natalie [name] is a 23 y.o. female presenting for vaginal itching x 2 days.  Patient denies vaginal pain, abdominal pain, flank pain, dysuria, vaginal discharge, known exposure to STD.  Patient denies using any over-the-counter medication to treat symptoms prior to arrival in urgent care.  Patient would like STD screening despite no known exposure.  No current facility-administered medications for this encounter.  Current Outpatient Medications:    VENTOLIN  HFA 108 (90 Base) MCG/ACT inhaler, SMARTSIG:2 Puff(s) By Mouth Every 6 Hours PRN, Disp: , Rfl:    Allergies  Allergen Reactions   Pollen Extract Other (See Comments)    Seasonal allergies  Other Reaction(s): Other (See Comments)    Seasonal allergies    Past Medical History:  Diagnosis Date   Allergy    Anemia affecting pregnancy in third trimester 10/04/2020   Asthma    Cesarean delivery delivered 12/10/2020   Breech presentation   Sciatica of right side 11/07/2020   Tonsillitis    Trichomoniasis      Past Surgical History:  Procedure Laterality Date   CESAREAN SECTION N/A 12/10/2020   Procedure: CESAREAN SECTION;  Surgeon: Ozan, Jennifer, DO;  Location: [facility];  Service: Obstetrics;  Laterality: N/A;   HYDRADENITIS EXCISION Bilateral 12/08/2019   Procedure: WIDE EXCISION BILATERAL AXILLARY HIDRADENITIS;  Surgeon: Vernetta Berg, MD;  Location: [facility];  Service: General;  Laterality: Bilateral;    Family History  Problem Relation Age of Onset   Learning disabilities Mother    Mental illness Mother        Depression   Diabetes Paternal Aunt    Asthma Maternal Grandmother    Hypertension Maternal Grandmother    Learning disabilities Maternal Grandmother    Mental illness Maternal Grandmother        Depression    Stroke Maternal Grandmother    Hypertension Maternal Grandfather    Alcohol abuse Maternal Grandfather    Drug abuse Maternal Grandfather     Social History   Tobacco Use   Smoking status: Former    Types: Cigars    Passive exposure: Never   Smokeless tobacco: Never   Tobacco comments:    last used 03-24-20  Vaping Use   Vaping status: Every Day   Substances: Nicotine  Substance Use Topics   Alcohol use: Yes   Drug use: Yes    Types: Marijuana    ROS Refer to HPI for ROS details.  Objective:    Vitals: BP 110/76 (BP Location: Right Arm)   Pulse 80   Temp 98.6 F (37 C) (Oral)   Resp 16   SpO2 97%   Physical Exam Vitals and nursing note reviewed.  Constitutional:      General: She is not in acute distress.    Appearance: Normal appearance. She is well-developed. She is not ill-appearing or toxic-appearing.  HENT:     Head: Normocephalic and atraumatic.  Cardiovascular:     Rate and Rhythm: Normal rate.  Pulmonary:     Effort: Pulmonary effort is normal. No respiratory distress.     Breath sounds: No stridor. No wheezing.  Abdominal:     General: Bowel sounds are normal. There is no distension.     Palpations: Abdomen is soft.     Tenderness: There is no abdominal tenderness. There is no right CVA tenderness or  left CVA tenderness.  Genitourinary:    Vagina: No vaginal discharge.  Skin:    General: Skin is warm and dry.  Neurological:     General: No focal deficit present.     Mental Status: She is alert and oriented to person, place, and time.  Psychiatric:        Mood and Affect: Mood normal.        Behavior: Behavior normal.     Procedures  Results for orders placed or performed during the hospital encounter of 07/05/24 (from the past 24 hours)  POCT urine pregnancy     Status: None   Collection Time: 07/05/24  5:23 PM  Result Value Ref Range   Preg Test, Ur Negative Negative    Assessment and Plan :     Discharge Instructions        1. Screening for STD (sexually transmitted disease) (Primary) - POCT urine pregnancy completed UC is negative for pregnancy - Cervicovaginal swab collected in UC and sent to lab for further testing results should be available in 2 to 3 days. - If any abnormal findings with final report patient will be contacted appropriate treatment provided - Continue to monitor for any symptoms if you develop any symptoms of STD or any other concerning symptoms follow-up in ER for further evaluation and management.       Creedence Heiss B Jaymian Bogart   Marte Celani, [place], [place] 07/05/24 1730

## 2024-07-05 NOTE — Discharge Instructions (Signed)
  1. Screening for STD (sexually transmitted disease) (Primary) - POCT urine pregnancy completed UC is negative for pregnancy - Cervicovaginal swab collected in UC and sent to lab for further testing results should be available in 2 to 3 days. - If any abnormal findings with final report patient will be contacted appropriate treatment provided - Continue to monitor for any symptoms if you develop any symptoms of STD or any other concerning symptoms follow-up in ER for further evaluation and management.

## 2024-07-06 ENCOUNTER — Ambulatory Visit (HOSPITAL_COMMUNITY): Payer: Self-pay

## 2024-07-06 LAB — CERVICOVAGINAL ANCILLARY ONLY
Bacterial Vaginitis (gardnerella): POSITIVE — AB
Candida Glabrata: NEGATIVE
Candida Vaginitis: NEGATIVE
Chlamydia: NEGATIVE
Comment: NEGATIVE
Comment: NEGATIVE
Comment: NEGATIVE
Comment: NEGATIVE
Comment: NEGATIVE
Comment: NORMAL
Neisseria Gonorrhea: NEGATIVE
Trichomonas: NEGATIVE

## 2024-07-06 MED ORDER — METRONIDAZOLE 500 MG PO TABS: 500.0000 mg | ORAL_TABLET | Freq: Two times a day (BID) | ORAL | 0 refills | Status: AC

## 2024-07-27 ENCOUNTER — Encounter: Payer: Self-pay | Admitting: Emergency Medicine

## 2024-07-27 ENCOUNTER — Ambulatory Visit
Admission: EM | Admit: 2024-07-27 | Discharge: 2024-07-27 | Disposition: A | Discharge status: Home / Self Care | Payer: MEDICAID | Attending: Emergency Medicine | Admitting: Emergency Medicine

## 2024-07-27 DIAGNOSIS — B349 Viral infection, unspecified: Secondary | ICD-10-CM

## 2024-07-27 DIAGNOSIS — J101 Influenza due to other identified influenza virus with other respiratory manifestations: Secondary | ICD-10-CM | POA: Diagnosis not present

## 2024-07-27 LAB — POCT INFLUENZA A/B
Influenza A, POC: POSITIVE — AB
Influenza B, POC: NEGATIVE

## 2024-07-27 MED ORDER — BENZONATATE 100 MG PO CAPS: 100.0000 mg | ORAL_CAPSULE | Freq: Three times a day (TID) | ORAL | 0 refills | Status: AC

## 2024-07-27 MED ORDER — PREDNISONE 10 MG (21) PO TBPK: ORAL_TABLET | Freq: Every day | ORAL | 0 refills | Status: AC

## 2024-07-27 MED ORDER — PROMETHAZINE-DM 6.25-15 MG/5ML PO SYRP: 5.0000 mL | ORAL_SOLUTION | Freq: Every evening | ORAL | 0 refills | Status: AC | PRN

## 2024-07-27 MED ORDER — OSELTAMIVIR PHOSPHATE 75 MG PO CAPS: 75.0000 mg | ORAL_CAPSULE | Freq: Two times a day (BID) | ORAL | 0 refills | Status: AC

## 2024-07-27 NOTE — ED Triage Notes (Signed)
 Patient reports fatigue, body aches , chills, fever x 1 day. Patient reports took Ibuprofen  with no relief. Last dose of Ibuprofen  was at 6 am today. Rates pain 9/10.

## 2024-07-27 NOTE — Discharge Instructions (Addendum)
 They are most likely being caused by influenza A, your symptoms are consistent with the current presentation, influenza is a virus and will steadily improve with time  You may take Tamiflu  twice daily for the next 5 days, if you are able to get this medication from the pharmacy then continue supportive treatment, symptoms will improve as the virus works as well after system whether or not you take this medication  Start tomorrow take prednisone  every morning with food to open and relax your airway making it easier for you to breathe if, will help with shortness of breath  You may use Tessalon pill every 8 hours as needed for coughing and may use cough syrup at bedtime to allow for rest    You can take Tylenol  and/or Ibuprofen  as needed for fever reduction and pain relief.   For cough: honey 1/2 to 1 teaspoon (you can dilute the honey in water  or another fluid).  You can also use guaifenesin and dextromethorphan for cough. You can use a humidifier for chest congestion and cough.  If you don't have a humidifier, you can sit in the bathroom with the hot shower running.      For sore throat: try warm salt water  gargles, cepacol lozenges, throat spray, warm tea or water  with lemon/honey, popsicles or ice, or OTC cold relief medicine for throat discomfort.   For congestion: take a daily anti-histamine like Zyrtec , Claritin, and a oral decongestant, such as pseudoephedrine.  You can also use Flonase  1-2 sprays in each nostril daily.   It is important to stay hydrated: drink plenty of fluids (water , gatorade/powerade/pedialyte, juices, or teas) to keep your throat moisturized and help further relieve irritation/discomfort.

## 2024-07-27 NOTE — ED Provider Notes (Signed)
 " [name]    CSN: [id number] Arrival date & time: 07/27/24  1257      History   Chief Complaint Chief Complaint  Patient presents with   Fatigue   Cough   Fever   Chills   Generalized Body Aches    HPI Natalie [name] is a 23 y.o. female.    Presents for evaluation of subjective fever, chills, nasal congestion, left-sided ear pain only with yawning, sore throat only with coughing, productive cough, shortness of breath at rest exacerbated by movement and nausea without vomiting beginning 1 day ago.  Known sick contacts.  Has attempted use of ibuprofen .  History of asthma, endorses inhaler and nebulizer available at home.      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   Depression 03/02/2021   IUD (intrauterine device) in place 12/10/2020   Hidradenitis suppurativa 10/08/2019   Dysmenorrhea 05/02/2015   Asthma, mild intermittent, well-controlled 10/15/2013   Allergic rhinitis 10/15/2013    Past Surgical History:  Procedure Laterality Date   CESAREAN SECTION N/A 12/10/2020   Procedure: CESAREAN SECTION;  Surgeon: Ozan, Jennifer, DO;  Location: [facility];  Service: Obstetrics;  Laterality: N/A;   HYDRADENITIS EXCISION Bilateral 12/08/2019   Procedure: WIDE EXCISION BILATERAL AXILLARY HIDRADENITIS;  Surgeon: Vernetta Berg, MD;  Location: [facility];  Service: General;  Laterality: Bilateral;    OB History     Gravida  1   Para      Term      Preterm      AB      Living         SAB      IAB      Ectopic      Multiple      Live Births               Home Medications    Prior to Admission medications  Medication Sig  Start Date End Date Taking? Authorizing Provider  VENTOLIN  HFA 108 (90 Base) MCG/ACT inhaler SMARTSIG:2 Puff(s) By Mouth Every 6 Hours PRN    [provider]    Family History Family History  Problem Relation Age of Onset   Learning disabilities Mother    Mental illness Mother        Depression   Diabetes Paternal Aunt    Asthma Maternal Grandmother    Hypertension Maternal Grandmother    Learning disabilities Maternal Grandmother    Mental illness Maternal Grandmother        Depression   Stroke Maternal Grandmother    Hypertension Maternal Grandfather    Alcohol abuse Maternal Grandfather    Drug abuse Maternal Grandfather     Social History Social History[1]   Allergies   Pollen extract   Review of Systems Review of Systems  Constitutional:  Positive for chills and fever. Negative for activity change, appetite change, diaphoresis, fatigue and unexpected weight change.  HENT:  Positive for congestion, ear pain and sore throat. Negative for dental problem, drooling, ear discharge, facial swelling, hearing loss, mouth sores, nosebleeds, postnasal drip, rhinorrhea, sinus pressure, sinus pain, sneezing, tinnitus, trouble swallowing  and voice change.   Respiratory:  Positive for cough and shortness of breath. Negative for apnea, choking, chest tightness, wheezing and stridor.   Gastrointestinal:  Positive for nausea. Negative for abdominal distention, abdominal pain, anal bleeding, blood in stool, constipation, diarrhea, rectal pain and vomiting.     Physical Exam Triage Vital Signs ED Triage Vitals  Encounter Vitals Group     BP 07/27/24 1652 109/75     Girls Systolic BP Percentile --      Girls Diastolic BP Percentile --      Boys Systolic BP Percentile --      Boys Diastolic BP Percentile --      Pulse Rate 07/27/24 1652 92     Resp 07/27/24 1652 20     Temp 07/27/24 1652 99.3 F (37.4 C)     Temp Source 07/27/24 1652 Oral     SpO2 07/27/24 1652 98 %      Weight --      Height --      Head Circumference --      Peak Flow --      Pain Score 07/27/24 1654 9     Pain Loc --      Pain Education --      Exclude from Growth Chart --    No data found.  Updated Vital Signs BP 109/75 (BP Location: Right Arm)   Pulse 92   Temp 99.3 F (37.4 C) (Oral)   Resp 20   SpO2 98%   Visual Acuity Right Eye Distance:   Left Eye Distance:   Bilateral Distance:    Right Eye Near:   Left Eye Near:    Bilateral Near:     Physical Exam Constitutional:      Appearance: Normal appearance.  HENT:     Head: Normocephalic.     Right Ear: Tympanic membrane, ear canal and external ear normal.     Left Ear: Tympanic membrane, ear canal and external ear normal.     Nose: Congestion present.     Mouth/Throat:     Pharynx: Posterior oropharyngeal erythema present. No oropharyngeal exudate.  Cardiovascular:     Rate and Rhythm: Normal rate and regular rhythm.     Pulses: Normal pulses.     Heart sounds: Normal heart sounds.  Pulmonary:     Effort: Pulmonary effort is normal.     Breath sounds: Normal breath sounds.  Neurological:     Mental Status: She is alert and oriented to person, place, and time. Mental status is at baseline.      UC Treatments / Results  Labs (all labs ordered are listed, but only abnormal results are displayed) Labs Reviewed  POCT INFLUENZA A/B - Abnormal; Notable for the following components:      Result Value   Influenza A, POC Positive (*)    All other components within normal limits    EKG   Radiology No results found.  Procedures Procedures (including critical care time)  Medications Ordered in UC Medications - No data to display  Initial Impression / Assessment and Plan / UC Course  I have reviewed the triage vital signs and the nursing notes.  Pertinent labs & imaging results that were available during my care of the patient were reviewed by me and considered in my medical decision making (see chart  for details).  Influenza A, viral illness  Patient is in no signs of distress nor toxic appearing.  Vital signs are stable.  Low  suspicion for pneumonia, pneumothorax or bronchitis and therefore will defer imaging.  Discussed quarantining if feverish, prescribed Tamiflu , prednisone  Tessalon and Promethazine  DM.May use additional over-the-counter medications as needed for supportive care.  May follow-up with urgent care as needed if symptoms persist or worsen.   Final Clinical Impressions(s) / UC Diagnoses   Final diagnoses:  Viral illness   Discharge Instructions   None    ED Prescriptions   None    PDMP not reviewed this encounter.     [1]  Social History Tobacco Use   Smoking status: Former    Types: Cigars    Passive exposure: Never   Smokeless tobacco: Never   Tobacco comments:    last used 03-24-20  Vaping Use   Vaping status: Every Day   Substances: Nicotine  Substance Use Topics   Alcohol use: Yes   Drug use: Yes    Types: Marijuana     Teresa Shelba SAUNDERS, NP 07/27/24 1717  "

## 2024-08-07 ENCOUNTER — Ambulatory Visit: Payer: MEDICAID

## 2024-08-11 ENCOUNTER — Ambulatory Visit: Payer: MEDICAID

## 2024-08-19 ENCOUNTER — Ambulatory Visit
Admission: RE | Admit: 2024-08-19 | Discharge: 2024-08-19 | Disposition: A | Discharge status: Home / Self Care | Payer: MEDICAID | Source: Ambulatory Visit | Attending: Emergency Medicine | Admitting: Emergency Medicine

## 2024-08-19 VITALS — BP 118/77 | HR 88 | Temp 98.3°F | Resp 18

## 2024-08-19 DIAGNOSIS — R350 Frequency of micturition: Secondary | ICD-10-CM | POA: Insufficient documentation

## 2024-08-19 DIAGNOSIS — N3001 Acute cystitis with hematuria: Secondary | ICD-10-CM | POA: Insufficient documentation

## 2024-08-19 LAB — POCT URINE DIPSTICK
Bilirubin, UA: NEGATIVE
Blood, UA: NEGATIVE
Glucose, UA: NEGATIVE mg/dL
Ketones, POC UA: NEGATIVE mg/dL
Nitrite, UA: POSITIVE — AB
POC PROTEIN,UA: NEGATIVE
Spec Grav, UA: 1.02
Urobilinogen, UA: 0.2 U/dL
pH, UA: 6

## 2024-08-19 MED ORDER — FLUCONAZOLE 150 MG PO TABS: 150.0000 mg | ORAL_TABLET | ORAL | 0 refills | Status: AC | PRN

## 2024-08-19 MED ORDER — NITROFURANTOIN MONOHYD MACRO 100 MG PO CAPS: 100.0000 mg | ORAL_CAPSULE | Freq: Two times a day (BID) | ORAL | 0 refills | Status: AC

## 2024-08-19 NOTE — ED Provider Notes (Signed)
 " [name]    CSN: [id number] Arrival date & time: 08/19/24  1357      History   Chief Complaint Chief Complaint  Patient presents with   Urinary Frequency    Entered by patient   Vaginal Itching    HPI Laquita Harlan is a 24 y.o. female.   Patient presents for evaluation of urinary frequency, foul urinary odor, hematuria and vaginal itching without discharge or odor present for 3 days.  Has attempted use of boric acid suppositories.  Sexually active no known exposure.  IUD in place.    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   Depression 03/02/2021   IUD (intrauterine device) in place 12/10/2020   Hidradenitis suppurativa 10/08/2019   Dysmenorrhea 05/02/2015   Asthma, mild intermittent, well-controlled 10/15/2013   Allergic rhinitis 10/15/2013    Past Surgical History:  Procedure Laterality Date   CESAREAN SECTION N/A 12/10/2020   Procedure: CESAREAN SECTION;  Surgeon: Ozan, Jennifer, DO;  Location: [facility];  Service: Obstetrics;  Laterality: N/A;   HYDRADENITIS EXCISION Bilateral 12/08/2019   Procedure: WIDE EXCISION BILATERAL AXILLARY HIDRADENITIS;  Surgeon: Vernetta Berg, MD;  Location: [facility] OR;  Service: General;  Laterality: Bilateral;    OB History     Gravida  1   Para      Term      Preterm      AB      Living         SAB      IAB      Ectopic      Multiple      Live Births               Home Medications    Prior to Admission medications  Medication Sig Start Date End Date Taking? Authorizing Provider  clindamycin  (CLINDAGEL) 1 % gel Apply 1 Application topically 2 (two) times daily. 08/11/24 08/26/24  Yes [provider]  doxycycline  (MONODOX ) 100 MG capsule Take 100 mg by mouth 2 (two) times daily. 08/11/24 08/21/24 Yes [provider]  benzonatate  (TESSALON ) 100 MG capsule Take 1 capsule (100 mg total) by mouth every 8 (eight) hours. 07/27/24   Oswell Say, Shelba SAUNDERS, NP  oseltamivir  (TAMIFLU ) 75 MG capsule Take 1 capsule (75 mg total) by mouth every 12 (twelve) hours. 07/27/24   Dondre Catalfamo, Shelba SAUNDERS, NP  predniSONE  (STERAPRED UNI-PAK 21 TAB) 10 MG (21) TBPK tablet Take by mouth daily. Take 6 tabs by mouth daily  for 1 days, then 5 tabs for 1 days, then 4 tabs for 1 days, then 3 tabs for 1 days, 2 tabs for 1 days, then 1 tab by mouth daily for 1 days 07/27/24   Teresa Shelba SAUNDERS, NP  promethazine -dextromethorphan (PROMETHAZINE -DM) 6.25-15 MG/5ML syrup Take 5 mLs by mouth at bedtime as needed. 07/27/24   Teresa Shelba SAUNDERS, NP  VENTOLIN  HFA 108 (90 Base) MCG/ACT inhaler SMARTSIG:2 Puff(s) By Mouth Every 6 Hours PRN    [provider]    Family History Family History  Problem Relation Age of Onset   Learning disabilities Mother    Mental illness  Mother        Depression   Diabetes Paternal Aunt    Asthma Maternal Grandmother    Hypertension Maternal Grandmother    Learning disabilities Maternal Grandmother    Mental illness Maternal Grandmother        Depression   Stroke Maternal Grandmother    Hypertension Maternal Grandfather    Alcohol abuse Maternal Grandfather    Drug abuse Maternal Grandfather     Social History Social History[1]   Allergies   Pollen extract   Review of Systems Review of Systems  Genitourinary:  Positive for dysuria, frequency and hematuria. Negative for decreased urine volume, difficulty urinating, dyspareunia, enuresis, flank pain, genital sores, menstrual problem, pelvic pain, urgency, vaginal bleeding, vaginal discharge and vaginal pain.     Physical Exam Triage Vital Signs ED Triage Vitals  Encounter Vitals Group     BP  08/19/24 1421 118/77     Girls Systolic BP Percentile --      Girls Diastolic BP Percentile --      Boys Systolic BP Percentile --      Boys Diastolic BP Percentile --      Pulse Rate 08/19/24 1421 88     Resp 08/19/24 1421 18     Temp 08/19/24 1421 98.3 F (36.8 C)     Temp Source 08/19/24 1421 Oral     SpO2 08/19/24 1421 99 %     Weight --      Height --      Head Circumference --      Peak Flow --      Pain Score 08/19/24 1423 0     Pain Loc --      Pain Education --      Exclude from Growth Chart --    No data found.  Updated Vital Signs BP 118/77 (BP Location: Right Arm)   Pulse 88   Temp 98.3 F (36.8 C) (Oral)   Resp 18   SpO2 99%   Visual Acuity Right Eye Distance:   Left Eye Distance:   Bilateral Distance:    Right Eye Near:   Left Eye Near:    Bilateral Near:     Physical Exam Constitutional:      Appearance: Normal appearance.  Eyes:     Extraocular Movements: Extraocular movements intact.  Pulmonary:     Effort: Pulmonary effort is normal.  Abdominal:     Tenderness: There is no abdominal tenderness. There is no right CVA tenderness, left CVA tenderness or guarding.  Genitourinary:    Comments: deferred Neurological:     Mental Status: She is alert and oriented to person, place, and time. Mental status is at baseline.      UC Treatments / Results  Labs (all labs ordered are listed, but only abnormal results are displayed) Labs Reviewed  URINE CULTURE  POCT URINE DIPSTICK    EKG   Radiology No results found.  Procedures Procedures (including critical care time)  Medications Ordered in UC Medications - No data to display  Initial Impression / Assessment and Plan / UC Course  I have reviewed the triage vital signs and the nursing notes.  Pertinent labs & imaging results that were available during my care of the patient were reviewed by me and considered in my medical decision making (see chart for details).  Acute cystitis with  hematuria, urinary frequency  Urinalysis showing nitrates and leukocytes and sent for culture prior urine culture showing E. coli therefore prescribed Macrobid ,  STI swabs pending treating empirically for yeast, Diflucan  prescribed and discussed administration, STI labs pending will treat per protocol, advised abstinence until lab results, and/or treatment is complete, advised condom use during all sexual encounters moving, may follow-up with urgent care as needed   Final diagnoses:  Urinary frequency   Discharge Instructions   None    ED Prescriptions   None    PDMP not reviewed this encounter.     [1]  Social History Tobacco Use   Smoking status: Former    Types: Cigars    Passive exposure: Never   Smokeless tobacco: Never   Tobacco comments:    last used 03-24-20  Vaping Use   Vaping status: Every Day   Substances: Nicotine  Substance Use Topics   Alcohol use: Yes   Drug use: Yes    Types: Marijuana     Teresa Shelba SAUNDERS, NP 08/19/24 1508  "

## 2024-08-19 NOTE — ED Triage Notes (Signed)
 Patient reports vaginal itching no discharge. Patient complains of urinary frequency and foul odor x 3 days. Patient used boric suppositories with no relief.

## 2024-08-19 NOTE — Discharge Instructions (Addendum)
 Your urinalysis shows Ezreal Turay blood cells and nitrates which are indicative of infection, your urine will be sent to the lab to determine exactly which bacteria is present, if any changes need to be made to your medications you will be notified  Begin use of Macrobid  twice a day for 5 days  You may use over-the-counter Azo to help minimize your symptoms until antibiotic removes bacteria, this medication will turn your urine orange  Increase your fluid intake through use of water   As always practice good hygiene, wiping front to back and avoidance of scented vaginal products to prevent further irritation  If symptoms continue to persist after use of medication or recur please follow-up with urgent care or your primary doctor as needed

## 2024-08-20 LAB — CERVICOVAGINAL ANCILLARY ONLY
Bacterial Vaginitis (gardnerella): NEGATIVE
Candida Glabrata: NEGATIVE
Candida Vaginitis: NEGATIVE
Chlamydia: NEGATIVE
Comment: NEGATIVE
Comment: NEGATIVE
Comment: NEGATIVE
Comment: NEGATIVE
Comment: NEGATIVE
Comment: NORMAL
Neisseria Gonorrhea: NEGATIVE
Trichomonas: NEGATIVE

## 2024-08-21 ENCOUNTER — Ambulatory Visit (HOSPITAL_COMMUNITY): Payer: Self-pay

## 2024-08-21 LAB — URINE CULTURE: Culture: >=100000 — AB
